# Patient Record
Sex: Female | Born: 1985 | Hispanic: Yes | Marital: Single | Smoking: Never smoker
Health system: Southern US, Community
[De-identification: ages and names within clinical notes are randomized; demographics above are authoritative.]

## PROBLEM LIST (undated history)

## (undated) ENCOUNTER — Inpatient Hospital Stay (HOSPITAL_COMMUNITY): Payer: Self-pay

## (undated) DIAGNOSIS — N189 Chronic kidney disease, unspecified: Secondary | ICD-10-CM

## (undated) DIAGNOSIS — J45909 Unspecified asthma, uncomplicated: Secondary | ICD-10-CM

## (undated) DIAGNOSIS — N83209 Unspecified ovarian cyst, unspecified side: Secondary | ICD-10-CM

## (undated) DIAGNOSIS — T4145XA Adverse effect of unspecified anesthetic, initial encounter: Secondary | ICD-10-CM

## (undated) DIAGNOSIS — T8859XA Other complications of anesthesia, initial encounter: Secondary | ICD-10-CM

## (undated) HISTORY — PX: NO PAST SURGERIES: SHX2092

---

## 2012-07-02 ENCOUNTER — Encounter (HOSPITAL_COMMUNITY): Payer: Self-pay | Admitting: Emergency Medicine

## 2012-07-02 DIAGNOSIS — N390 Urinary tract infection, site not specified: Secondary | ICD-10-CM | POA: Insufficient documentation

## 2012-07-02 DIAGNOSIS — M549 Dorsalgia, unspecified: Secondary | ICD-10-CM | POA: Insufficient documentation

## 2012-07-02 LAB — POCT PREGNANCY, URINE: Preg Test, Ur: NEGATIVE

## 2012-07-02 LAB — URINE MICROSCOPIC-ADD ON

## 2012-07-02 LAB — CBC WITH DIFFERENTIAL/PLATELET
Basophils Relative: 0 % (ref 0–1)
Eosinophils Relative: 2 % (ref 0–5)
HCT: 33.2 % — ABNORMAL LOW (ref 36.0–46.0)
Hemoglobin: 11.2 g/dL — ABNORMAL LOW (ref 12.0–15.0)
MCH: 29.6 pg (ref 26.0–34.0)
MCHC: 33.7 g/dL (ref 30.0–36.0)
MCV: 87.6 fL (ref 78.0–100.0)
Monocytes Absolute: 0.7 10*3/uL (ref 0.1–1.0)
Monocytes Relative: 8 % (ref 3–12)
Neutro Abs: 4.3 10*3/uL (ref 1.7–7.7)
RDW: 15.7 % — ABNORMAL HIGH (ref 11.5–15.5)

## 2012-07-02 LAB — COMPREHENSIVE METABOLIC PANEL
Albumin: 4 g/dL (ref 3.5–5.2)
BUN: 7 mg/dL (ref 6–23)
Calcium: 9.7 mg/dL (ref 8.4–10.5)
Chloride: 103 mEq/L (ref 96–112)
Creatinine, Ser: 0.59 mg/dL (ref 0.50–1.10)
GFR calc non Af Amer: >90 mL/min (ref >90)
Total Bilirubin: 0.2 mg/dL — ABNORMAL LOW (ref 0.3–1.2)

## 2012-07-02 LAB — URINALYSIS, ROUTINE W REFLEX MICROSCOPIC
Bilirubin Urine: NEGATIVE
Glucose, UA: NEGATIVE mg/dL
Ketones, ur: NEGATIVE mg/dL
Protein, ur: NEGATIVE mg/dL
pH: 7 (ref 5.0–8.0)

## 2012-07-02 NOTE — ED Notes (Signed)
Patient complaining of left and right flank pain, fever, burning during urination, and pressure in her lower abdomen.  Patient also complaining of nausea.  Denies diarrhea.  Patient reports history of urinary tract infections in the past.

## 2012-07-03 ENCOUNTER — Emergency Department (HOSPITAL_COMMUNITY)
Admission: EM | Admit: 2012-07-03 | Discharge: 2012-07-03 | Disposition: A | Discharge status: Home / Self Care | Payer: Self-pay | Attending: Emergency Medicine | Admitting: Emergency Medicine

## 2012-07-03 DIAGNOSIS — M549 Dorsalgia, unspecified: Secondary | ICD-10-CM

## 2012-07-03 DIAGNOSIS — N390 Urinary tract infection, site not specified: Secondary | ICD-10-CM

## 2012-07-03 MED ORDER — CYCLOBENZAPRINE HCL 5 MG PO TABS: 5.0000 mg | ORAL_TABLET | Freq: Three times a day (TID) | ORAL | Status: AC | PRN

## 2012-07-03 MED ORDER — CEPHALEXIN 250 MG PO CAPS
500.0000 mg | ORAL_CAPSULE | Freq: Once | ORAL | Status: AC
  Administered 2012-07-03: 500 mg via ORAL
  Filled 2012-07-03: qty 2

## 2012-07-03 MED ORDER — PHENAZOPYRIDINE HCL 100 MG PO TABS
200.0000 mg | ORAL_TABLET | Freq: Once | ORAL | Status: AC
  Administered 2012-07-03: 200 mg via ORAL
  Filled 2012-07-03: qty 2

## 2012-07-03 MED ORDER — PHENAZOPYRIDINE HCL 200 MG PO TABS: 200.0000 mg | ORAL_TABLET | Freq: Three times a day (TID) | ORAL | Status: AC

## 2012-07-03 MED ORDER — KETOROLAC TROMETHAMINE 60 MG/2ML IM SOLN
60.0000 mg | Freq: Once | INTRAMUSCULAR | Status: AC
  Administered 2012-07-03: 60 mg via INTRAMUSCULAR
  Filled 2012-07-03: qty 2

## 2012-07-03 MED ORDER — CEPHALEXIN 500 MG PO CAPS: 500.0000 mg | ORAL_CAPSULE | Freq: Three times a day (TID) | ORAL | Status: AC

## 2012-07-03 NOTE — ED Provider Notes (Signed)
History     CSN: [id number]  Arrival date & time 07/02/12  2202   First MD Initiated Contact with Patient 07/03/12 [phone]      Chief Complaint  Patient presents with  . Urinary Tract Infection  . Flank Pain    (Consider location/radiation/quality/duration/timing/severity/associated sxs/prior treatment) HPI  Patient reports about 3 days ago she started having dysuria, frequency, but denies urgency or hematuria. She's had nausea without vomiting or diarrhea. She relates she has pain in her bilateral flanks and over her suprapubic region. She denies any vaginal discharge.  PCP women[facility] health department in [facility]  History reviewed. No pertinent past medical history.  History reviewed. No pertinent past surgical history.  History reviewed. No pertinent family history.  History  Substance Use Topics  . Smoking status: Never Smoker   . Smokeless tobacco: Not on file  . Alcohol Use: Yes  employed  OB History    Grav Para Term Preterm Abortions TAB SAB Ect Mult Living                  Review of Systems  All other systems reviewed and are negative.    Allergies  Review of patient's allergies indicates no known allergies.  Home Medications   Current Outpatient Rx  Name Route Sig Dispense Refill  . CEPHALEXIN 500 MG PO CAPS Oral Take 1 capsule (500 mg total) by mouth 3 (three) times daily. 30 capsule 0  . CYCLOBENZAPRINE HCL 5 MG PO TABS Oral Take 1 tablet (5 mg total) by mouth 3 (three) times daily as needed for muscle spasms. 30 tablet 0  . PHENAZOPYRIDINE HCL 200 MG PO TABS Oral Take 1 tablet (200 mg total) by mouth 3 (three) times daily. 6 tablet 0   Implanon  BP 100/59  Pulse 63  Temp 98 F (36.7 C) (Oral)  Resp 16  SpO2 100%  LMP 06/24/2012  Vital signs normal    Physical Exam  Nursing note and vitals reviewed. Constitutional: She is oriented to person, place, and time. She appears well-developed and well-nourished.  Non-toxic appearance. She does  not appear ill. No distress.  HENT:  Head: Normocephalic and atraumatic.  Right Ear: External ear normal.  Left Ear: External ear normal.  Nose: Nose normal. No mucosal edema or rhinorrhea.  Mouth/Throat: Oropharynx is clear and moist and mucous membranes are normal. No dental abscesses or uvula swelling.  Eyes: Conjunctivae and EOM are normal. Pupils are equal, round, and reactive to light.  Neck: Normal range of motion and full passive range of motion without pain. Neck supple.  Cardiovascular: Normal rate, regular rhythm and normal heart sounds.  Exam reveals no gallop and no friction rub.   No murmur heard. Pulmonary/Chest: Effort normal and breath sounds normal. No respiratory distress. She has no wheezes. She has no rhonchi. She has no rales. She exhibits no tenderness and no crepitus.  Abdominal: Soft. Normal appearance and bowel sounds are normal. She exhibits no distension. There is tenderness. There is no rebound and no guarding.         Patient has bilateral flank tenderness and is tender to palpation and changing positions.  Musculoskeletal: Normal range of motion. She exhibits no edema and no tenderness.       Arms:      Moves all extremities well.   Neurological: She is alert and oriented to person, place, and time. She has normal strength. No cranial nerve deficit.  Skin: Skin is warm, dry and intact. No  rash noted. No erythema. No pallor.  Psychiatric: She has a normal mood and affect. Her speech is normal and behavior is normal. Her mood appears not anxious.    ED Course  Procedures (including critical care time)   Medications  ketorolac (TORADOL) injection 60 mg (60 mg Intramuscular Given 07/03/12 0439)  phenazopyridine (PYRIDIUM) tablet 200 mg (200 mg Oral Given 07/03/12 0438)  cephALEXin (KEFLEX) capsule 500 mg (500 mg Oral Given 07/03/12 0438)      Results for orders placed during the hospital encounter of 07/03/12  URINALYSIS, ROUTINE W REFLEX MICROSCOPIC       Component Value Range   Color, Urine YELLOW  YELLOW   APPearance CLEAR  CLEAR   Specific Gravity, Urine 1.009  1.005 - 1.030   pH 7.0  5.0 - 8.0   Glucose, UA NEGATIVE  NEGATIVE mg/dL   Hgb urine dipstick TRACE (*) NEGATIVE   Bilirubin Urine NEGATIVE  NEGATIVE   Ketones, ur NEGATIVE  NEGATIVE mg/dL   Protein, ur NEGATIVE  NEGATIVE mg/dL   Urobilinogen, UA 0.2  0.0 - 1.0 mg/dL   Nitrite NEGATIVE  NEGATIVE   Leukocytes, UA SMALL (*) NEGATIVE  CBC WITH DIFFERENTIAL      Component Value Range   WBC 8.3  4.0 - 10.5 K/uL   RBC 3.79 (*) 3.87 - 5.11 MIL/uL   Hemoglobin 11.2 (*) 12.0 - 15.0 g/dL   HCT 11.9 (*) 14.7 - 82.9 %   MCV 87.6  78.0 - 100.0 fL   MCH 29.6  26.0 - 34.0 pg   MCHC 33.7  30.0 - 36.0 g/dL   RDW 56.2 (*) 13.0 - 86.5 %   Platelets 296  150 - 400 K/uL   Neutrophils Relative 52  43 - 77 %   Neutro Abs 4.3  1.7 - 7.7 K/uL   Lymphocytes Relative 37  12 - 46 %   Lymphs Abs 3.0  0.7 - 4.0 K/uL   Monocytes Relative 8  3 - 12 %   Monocytes Absolute 0.7  0.1 - 1.0 K/uL   Eosinophils Relative 2  0 - 5 %   Eosinophils Absolute 0.2  0.0 - 0.7 K/uL   Basophils Relative 0  0 - 1 %   Basophils Absolute 0.0  0.0 - 0.1 K/uL  COMPREHENSIVE METABOLIC PANEL      Component Value Range   Sodium 138  135 - 145 mEq/L   Potassium 4.2  3.5 - 5.1 mEq/L   Chloride 103  96 - 112 mEq/L   CO2 26  19 - 32 mEq/L   Glucose, Bld 88  70 - 99 mg/dL   BUN 7  6 - 23 mg/dL   Creatinine, Ser 7.84  0.50 - 1.10 mg/dL   Calcium 9.7  8.4 - 69.6 mg/dL   Total Protein 8.0  6.0 - 8.3 g/dL   Albumin 4.0  3.5 - 5.2 g/dL   AST 11  0 - 37 U/L   ALT 8  0 - 35 U/L   Alkaline Phosphatase 83  39 - 117 U/L   Total Bilirubin 0.2 (*) 0.3 - 1.2 mg/dL   GFR calc non Af Amer >90  >90 mL/min   GFR calc Af Amer >90  >90 mL/min  POCT PREGNANCY, URINE      Component Value Range   Preg Test, Ur NEGATIVE  NEGATIVE  URINE MICROSCOPIC-ADD ON      Component Value Range   Squamous Epithelial / LPF FEW (*) RARE  WBC, UA  3-6  <3 WBC/hpf   RBC / HPF 0-2  <3 RBC/hpf   Bacteria, UA RARE  RARE   Laboratory interpretation all normal except possible UTI, mild anemia       1. Urinary tract infection   2. Back pain     New Prescriptions   CEPHALEXIN (KEFLEX) 500 MG CAPSULE    Take 1 capsule (500 mg total) by mouth 3 (three) times daily.   CYCLOBENZAPRINE (FLEXERIL) 5 MG TABLET    Take 1 tablet (5 mg total) by mouth 3 (three) times daily as needed for muscle spasms.   PHENAZOPYRIDINE (PYRIDIUM) 200 MG TABLET    Take 1 tablet (200 mg total) by mouth 3 (three) times daily.  ibuprofen 600 mg QID OTC  Plan discharge  Devoria Albe, MD, FACEP   MDM          Ward Givens, MD 07/03/12 [phone]

## 2013-08-08 ENCOUNTER — Emergency Department (HOSPITAL_COMMUNITY): Payer: Self-pay

## 2013-08-08 ENCOUNTER — Emergency Department (HOSPITAL_COMMUNITY)
Admission: EM | Admit: 2013-08-08 | Discharge: 2013-08-08 | Disposition: A | Discharge status: Home / Self Care | Payer: Self-pay | Attending: Emergency Medicine | Admitting: Emergency Medicine

## 2013-08-08 ENCOUNTER — Encounter (HOSPITAL_COMMUNITY): Payer: Self-pay | Admitting: Emergency Medicine

## 2013-08-08 DIAGNOSIS — N939 Abnormal uterine and vaginal bleeding, unspecified: Secondary | ICD-10-CM

## 2013-08-08 DIAGNOSIS — N898 Other specified noninflammatory disorders of vagina: Secondary | ICD-10-CM | POA: Insufficient documentation

## 2013-08-08 DIAGNOSIS — Z3202 Encounter for pregnancy test, result negative: Secondary | ICD-10-CM | POA: Insufficient documentation

## 2013-08-08 DIAGNOSIS — R1084 Generalized abdominal pain: Secondary | ICD-10-CM | POA: Insufficient documentation

## 2013-08-08 DIAGNOSIS — N949 Unspecified condition associated with female genital organs and menstrual cycle: Secondary | ICD-10-CM | POA: Insufficient documentation

## 2013-08-08 DIAGNOSIS — N83209 Unspecified ovarian cyst, unspecified side: Secondary | ICD-10-CM | POA: Insufficient documentation

## 2013-08-08 HISTORY — DX: Unspecified ovarian cyst, unspecified side: N83.209

## 2013-08-08 LAB — WET PREP, GENITAL

## 2013-08-08 LAB — CBC WITH DIFFERENTIAL/PLATELET
Basophils Relative: 1 % (ref 0–1)
Eosinophils Absolute: 0.1 10*3/uL (ref 0.0–0.7)
Eosinophils Relative: 2 % (ref 0–5)
HCT: 30.6 % — ABNORMAL LOW (ref 36.0–46.0)
Hemoglobin: 10.5 g/dL — ABNORMAL LOW (ref 12.0–15.0)
MCH: 30.3 pg (ref 26.0–34.0)
MCHC: 34.3 g/dL (ref 30.0–36.0)
MCV: 88.2 fL (ref 78.0–100.0)
Monocytes Absolute: 0.6 10*3/uL (ref 0.1–1.0)
Monocytes Relative: 10 % (ref 3–12)

## 2013-08-08 LAB — BASIC METABOLIC PANEL
BUN: 11 mg/dL (ref 6–23)
Calcium: 8.4 mg/dL (ref 8.4–10.5)
Chloride: 106 mEq/L (ref 96–112)
Creatinine, Ser: 0.66 mg/dL (ref 0.50–1.10)
GFR calc Af Amer: >90 mL/min (ref >90)
GFR calc non Af Amer: >90 mL/min (ref >90)

## 2013-08-08 LAB — URINALYSIS, ROUTINE W REFLEX MICROSCOPIC
Bilirubin Urine: NEGATIVE
Glucose, UA: NEGATIVE mg/dL
Ketones, ur: NEGATIVE mg/dL
Protein, ur: NEGATIVE mg/dL
pH: 6.5 (ref 5.0–8.0)

## 2013-08-08 LAB — POCT PREGNANCY, URINE: Preg Test, Ur: NEGATIVE

## 2013-08-08 MED ORDER — HYDROCODONE-ACETAMINOPHEN 5-325 MG PO TABS: 1.0000 | ORAL_TABLET | ORAL | Status: DC | PRN

## 2013-08-08 MED ORDER — ONDANSETRON HCL 4 MG/2ML IJ SOLN
4.0000 mg | Freq: Once | INTRAMUSCULAR | Status: AC
  Administered 2013-08-08: 4 mg via INTRAVENOUS
  Filled 2013-08-08: qty 2

## 2013-08-08 MED ORDER — MORPHINE SULFATE 4 MG/ML IJ SOLN
4.0000 mg | Freq: Once | INTRAMUSCULAR | Status: AC
  Administered 2013-08-08: 4 mg via INTRAVENOUS
  Filled 2013-08-08: qty 1

## 2013-08-08 MED ORDER — KETOROLAC TROMETHAMINE 30 MG/ML IJ SOLN
30.0000 mg | Freq: Once | INTRAMUSCULAR | Status: AC
  Administered 2013-08-08: 30 mg via INTRAVENOUS
  Filled 2013-08-08: qty 1

## 2013-08-08 MED ORDER — PROMETHAZINE HCL 25 MG PO TABS: 25.0000 mg | ORAL_TABLET | Freq: Four times a day (QID) | ORAL | Status: DC | PRN

## 2013-08-08 MED ORDER — SODIUM CHLORIDE 0.9 % IV BOLUS (SEPSIS)
1000.0000 mL | Freq: Once | INTRAVENOUS | Status: AC
  Administered 2013-08-08: 1000 mL via INTRAVENOUS

## 2013-08-08 NOTE — ED Notes (Signed)
Pt ambulatory to BR, unable to provide urine sample, PA notified, ok to in/out pt to obtain urine sample

## 2013-08-08 NOTE — ED Provider Notes (Signed)
CSN: [id number]     Arrival date & time 08/08/13  1454 History   First MD Initiated Contact with Patient 08/08/13 1548     Chief Complaint  Patient presents with  . Abdominal Pain   (Consider location/radiation/quality/duration/timing/severity/associated sxs/prior Treatment) HPI  27 year old female with history of ovarian cyst presents complaining of abdominal pain and vaginal bleeding. Patient states for the past 3 months she has had persistent vaginal bleeding. Describe vaginal bleeding as having 1-2 pads per day for 4-5 days out of each week ongoing for 3 months. Complaining of pressure sensation to the lower abdomen, nonradiating, mild to moderate. Has notice clots in her vaginal bleeding. No specific treatment tried. Denies any fever, lightheadedness, dizziness, chest pain, shortness of breath, back pain, dysuria, hematuria, vaginal discharge, or rash. She is a G2 P2. She has implanon for the past 3 years.   Past Medical History  Diagnosis Date  . Ovarian cyst    History reviewed. No pertinent past surgical history. No family history on file. History  Substance Use Topics  . Smoking status: Never Smoker   . Smokeless tobacco: Not on file  . Alcohol Use: Yes   OB History   Grav Para Term Preterm Abortions TAB SAB Ect Mult Living                 Review of Systems  Respiratory: Negative for shortness of breath.   Cardiovascular: Negative for chest pain.  Gastrointestinal: Positive for abdominal pain.  Genitourinary: Positive for vaginal bleeding and pelvic pain. Negative for dysuria and flank pain.  Musculoskeletal: Negative for back pain.  Skin: Negative for rash.  All other systems reviewed and are negative.    Allergies  Review of patient's allergies indicates no known allergies.  Home Medications  No current outpatient prescriptions on file. There were no vitals taken for this visit. Physical Exam  Nursing note and vitals reviewed. Constitutional: She appears  well-developed and well-nourished. No distress.  Awake, alert, nontoxic appearance  HENT:  Head: Normocephalic and atraumatic.  Eyes: Conjunctivae are normal. Right eye exhibits no discharge. Left eye exhibits no discharge.  Neck: Normal range of motion. Neck supple.  Cardiovascular: Normal rate and regular rhythm.   Pulmonary/Chest: Effort normal and breath sounds normal. No respiratory distress. She exhibits no tenderness.  Abdominal: Soft. There is tenderness (Diffuse abdominal tenderness most significant to suprapubic without guarding or rebound tenderness. Abdomen is nondistended. No peritoneal sign.). There is no rebound.  Genitourinary: Uterus normal. There is no rash or lesion on the right labia. There is no rash or lesion on the left labia. Cervix exhibits no motion tenderness and no discharge. Right adnexum displays tenderness. Right adnexum displays no mass. Left adnexum displays tenderness. Left adnexum displays no mass. There is bleeding around the vagina. No erythema or tenderness around the vagina. No vaginal discharge found.  Chaperone present  Musculoskeletal: She exhibits no tenderness.  ROM appears intact, no obvious focal weakness  Lymphadenopathy:       Right: No inguinal adenopathy present.       Left: No inguinal adenopathy present.  Neurological:  Mental status and motor strength appears intact  Skin: No rash noted.  Psychiatric: She has a normal mood and affect.    ED Course  Procedures (including critical care time)  4:09 PM Patient with menorrhagia. Has adnexal tenderness on exam without CMT.  Work up initiated.   7:48 PM Ultrasound shows a 3.2 cm diameter right ovarian cyst. No other acute finding noted.  Patient continues to endorse low abdominal pain. Currently awaits UA and pregnancy test. We'll continue to manage her symptoms.  8:42 PM Pregnancy test is negative, urinalysis shows no evidence of UTI. The symptom has improved minimally, we'll give pain  medication we'll continue to monitor. Patient will receive outpatient referral to OB/GYN for further care. Doubt appendicitis as pt report having her lower abd pain x 1 month.  No significant anemia.  Can tolerates PO.  Referral to OBGYN.      Labs Review Labs Reviewed  WET PREP, GENITAL - Abnormal; Notable for the following:    Clue Cells Wet Prep HPF POC FEW (*)    WBC, Wet Prep HPF POC FEW (*)    All other components within normal limits  CBC WITH DIFFERENTIAL - Abnormal; Notable for the following:    RBC 3.47 (*)    Hemoglobin 10.5 (*)    HCT 30.6 (*)    Neutrophils Relative % 42 (*)    All other components within normal limits  GC/CHLAMYDIA PROBE AMP  URINALYSIS, ROUTINE W REFLEX MICROSCOPIC  BASIC METABOLIC PANEL  POCT PREGNANCY, URINE   Imaging Review US Transvaginal Non-ob  08/08/2013   CLINICAL DATA:  Vaginal bleeding  EXAM: TRANSABDOMINAL AND TRANSVAGINAL ULTRASOUND OF PELVIS  TECHNIQUE: Both transabdominal and transvaginal ultrasound examinations of the pelvis were performed. Transabdominal technique was performed for global imaging of the pelvis including uterus, ovaries, adnexal regions, and pelvic cul-de-sac. It was necessary to proceed with endovaginal exam following the transabdominal exam to visualize the endometrium and adnexae.  COMPARISON:  None  FINDINGS: Uterus  Measurements: 8.7 x 3.8 x 4.5 cm. Normal morphology without mass.  Endometrium  Thickness: 4 mm thick, normal. No mass or endometrial fluid.  Right ovary  Measurements: 4.0 x 2.5 x 2.9 cm. 3.2 x 1.4 x 2.1 cm diameter simple cyst within right ovary. Internal blood flow present within right ovary on color Doppler imaging.  Left ovary  Measurements: 3.7 x 2.5 x 2.2 cm. Normal morphology without mass. Internal blood flow present within left ovary on color Doppler imaging.  Other findings  No adnexal masses or free pelvic fluid otherwise seen.  IMPRESSION: 3.2 cm diameter right ovarian cyst.  Otherwise normal exam.    Electronically Signed   By: Ulyses Southward M.D.   On: 08/08/2013 18:13   US Pelvis Complete  08/08/2013   CLINICAL DATA:  Vaginal bleeding  EXAM: TRANSABDOMINAL AND TRANSVAGINAL ULTRASOUND OF PELVIS  TECHNIQUE: Both transabdominal and transvaginal ultrasound examinations of the pelvis were performed. Transabdominal technique was performed for global imaging of the pelvis including uterus, ovaries, adnexal regions, and pelvic cul-de-sac. It was necessary to proceed with endovaginal exam following the transabdominal exam to visualize the endometrium and adnexae.  COMPARISON:  None  FINDINGS: Uterus  Measurements: 8.7 x 3.8 x 4.5 cm. Normal morphology without mass.  Endometrium  Thickness: 4 mm thick, normal. No mass or endometrial fluid.  Right ovary  Measurements: 4.0 x 2.5 x 2.9 cm. 3.2 x 1.4 x 2.1 cm diameter simple cyst within right ovary. Internal blood flow present within right ovary on color Doppler imaging.  Left ovary  Measurements: 3.7 x 2.5 x 2.2 cm. Normal morphology without mass. Internal blood flow present within left ovary on color Doppler imaging.  Other findings  No adnexal masses or free pelvic fluid otherwise seen.  IMPRESSION: 3.2 cm diameter right ovarian cyst.  Otherwise normal exam.   Electronically Signed   By: Angelyn Punt.D.  On: 08/08/2013 18:13    MDM   1. Ovarian cyst   2. Lower abdominal pain, unspecified laterality   3. Vaginal bleeding    BP 103/66  Pulse 60  Temp(Src) 98.5 F (36.9 C) (Oral)  Resp 16  I have reviewed nursing notes and vital signs. I personally reviewed the imaging tests through PACS system  I reviewed available ER/hospitalization records thought the EMR     Fayrene Helper, [place] 08/08/13 2140

## 2013-08-08 NOTE — ED Notes (Signed)
Pt states that she has an ovarian cyst and that is causing her vaginal bleeding

## 2013-08-08 NOTE — ED Notes (Signed)
Awaiting pts ride to return

## 2013-08-20 NOTE — ED Provider Notes (Signed)
Medical screening examination/treatment/procedure(s) were performed by non-physician practitioner and as supervising physician I was immediately available for consultation/collaboration.    Bronsyn Shappell L Cassell Voorhies, MD 08/20/13 0802 

## 2014-05-13 ENCOUNTER — Emergency Department (HOSPITAL_COMMUNITY): Payer: Self-pay

## 2014-05-13 ENCOUNTER — Emergency Department (HOSPITAL_COMMUNITY)
Admission: EM | Admit: 2014-05-13 | Discharge: 2014-05-13 | Disposition: A | Discharge status: Home / Self Care | Payer: Self-pay | Attending: Emergency Medicine | Admitting: Emergency Medicine

## 2014-05-13 ENCOUNTER — Encounter (HOSPITAL_COMMUNITY): Payer: Self-pay | Admitting: Emergency Medicine

## 2014-05-13 DIAGNOSIS — K5900 Constipation, unspecified: Secondary | ICD-10-CM | POA: Insufficient documentation

## 2014-05-13 DIAGNOSIS — N83209 Unspecified ovarian cyst, unspecified side: Secondary | ICD-10-CM | POA: Insufficient documentation

## 2014-05-13 DIAGNOSIS — Z3202 Encounter for pregnancy test, result negative: Secondary | ICD-10-CM | POA: Insufficient documentation

## 2014-05-13 DIAGNOSIS — N83202 Unspecified ovarian cyst, left side: Secondary | ICD-10-CM

## 2014-05-13 DIAGNOSIS — R300 Dysuria: Secondary | ICD-10-CM | POA: Insufficient documentation

## 2014-05-13 DIAGNOSIS — N83201 Unspecified ovarian cyst, right side: Secondary | ICD-10-CM

## 2014-05-13 LAB — CBC WITH DIFFERENTIAL/PLATELET
BASOS ABS: 0 10*3/uL (ref 0.0–0.1)
Basophils Relative: 0 % (ref 0–1)
EOS ABS: 0.1 10*3/uL (ref 0.0–0.7)
EOS PCT: 1 % (ref 0–5)
HCT: 37.6 % (ref 36.0–46.0)
Hemoglobin: 12.6 g/dL (ref 12.0–15.0)
Lymphocytes Relative: 31 % (ref 12–46)
Lymphs Abs: 3.1 10*3/uL (ref 0.7–4.0)
MCH: 29.2 pg (ref 26.0–34.0)
MCHC: 33.5 g/dL (ref 30.0–36.0)
MCV: 87 fL (ref 78.0–100.0)
Monocytes Absolute: 0.7 10*3/uL (ref 0.1–1.0)
Monocytes Relative: 7 % (ref 3–12)
NEUTROS PCT: 61 % (ref 43–77)
Neutro Abs: 6 10*3/uL (ref 1.7–7.7)
PLATELETS: 297 10*3/uL (ref 150–400)
RBC: 4.32 MIL/uL (ref 3.87–5.11)
RDW: 14.4 % (ref 11.5–15.5)
WBC: 9.9 10*3/uL (ref 4.0–10.5)

## 2014-05-13 LAB — URINALYSIS, ROUTINE W REFLEX MICROSCOPIC
Bilirubin Urine: NEGATIVE
GLUCOSE, UA: NEGATIVE mg/dL
Hgb urine dipstick: NEGATIVE
Ketones, ur: NEGATIVE mg/dL
LEUKOCYTES UA: NEGATIVE
Nitrite: NEGATIVE
PH: 6 (ref 5.0–8.0)
PROTEIN: NEGATIVE mg/dL
Specific Gravity, Urine: 1.011 (ref 1.005–1.030)
Urobilinogen, UA: 0.2 mg/dL (ref 0.0–1.0)

## 2014-05-13 LAB — COMPREHENSIVE METABOLIC PANEL
ALT: 10 U/L (ref 0–35)
AST: 10 U/L (ref 0–37)
Albumin: 3.9 g/dL (ref 3.5–5.2)
Alkaline Phosphatase: 83 U/L (ref 39–117)
Anion gap: 11 (ref 5–15)
BUN: 13 mg/dL (ref 6–23)
CALCIUM: 9.8 mg/dL (ref 8.4–10.5)
CO2: 25 mEq/L (ref 19–32)
Chloride: 101 mEq/L (ref 96–112)
Creatinine, Ser: 0.62 mg/dL (ref 0.50–1.10)
GFR calc Af Amer: >90 mL/min (ref >90)
GFR calc non Af Amer: >90 mL/min (ref >90)
Glucose, Bld: 88 mg/dL (ref 70–99)
POTASSIUM: 4.4 meq/L (ref 3.7–5.3)
SODIUM: 137 meq/L (ref 137–147)
TOTAL PROTEIN: 8.4 g/dL — AB (ref 6.0–8.3)
Total Bilirubin: 0.2 mg/dL — ABNORMAL LOW (ref 0.3–1.2)

## 2014-05-13 LAB — WET PREP, GENITAL
TRICH WET PREP: NONE SEEN
YEAST WET PREP: NONE SEEN

## 2014-05-13 LAB — POC URINE PREG, ED: Preg Test, Ur: NEGATIVE

## 2014-05-13 MED ORDER — HYDROCODONE-ACETAMINOPHEN 5-325 MG PO TABS: 1.0000 | ORAL_TABLET | ORAL | Status: DC | PRN

## 2014-05-13 MED ORDER — MORPHINE SULFATE 4 MG/ML IJ SOLN
4.0000 mg | Freq: Once | INTRAMUSCULAR | Status: AC
  Administered 2014-05-13: 4 mg via INTRAVENOUS
  Filled 2014-05-13: qty 1

## 2014-05-13 MED ORDER — ONDANSETRON HCL 4 MG/2ML IJ SOLN
4.0000 mg | Freq: Once | INTRAMUSCULAR | Status: AC
  Administered 2014-05-13: 4 mg via INTRAVENOUS
  Filled 2014-05-13: qty 2

## 2014-05-13 MED ORDER — SODIUM CHLORIDE 0.9 % IV BOLUS (SEPSIS)
1000.0000 mL | Freq: Once | INTRAVENOUS | Status: AC
  Administered 2014-05-13: 1000 mL via INTRAVENOUS

## 2014-05-13 NOTE — ED Notes (Signed)
Pt presents to ed with c/o right lower abdominal pain with radiation to back, also reports nausea and vomiting, sts pressure with urination

## 2014-05-13 NOTE — ED Provider Notes (Signed)
Medical screening examination/treatment/procedure(s) were performed by non-physician practitioner and as supervising physician I was immediately available for consultation/collaboration.   EKG Interpretation None       Ethelda ChickMartha K Linker, MD 05/13/14 2312

## 2014-05-13 NOTE — Discharge Instructions (Signed)
Take vicodin as needed for severe pain - Please be careful with this medication.  It can cause drowsiness.  Use caution while driving, operating machinery, drinking alcohol, or any other activities that may impair your physical or mental abilities.   Take Ibuprofen as needed for mild-moderate pain  Return to the emergency department if you develop any changing/worsening condition, fever, change or worsening pain, repeated vomiting, or any other concerns (please read additional information regarding your condition below)   Ovarian Cyst An ovarian cyst is a fluid-filled sac that forms on an ovary. The ovaries are small organs that produce eggs in women. Various types of cysts can form on the ovaries. Most are not cancerous. Many do not cause problems, and they often go away on their own. Some may cause symptoms and require treatment. Common types of ovarian cysts include:  Functional cysts--These cysts may occur every month during the menstrual cycle. This is normal. The cysts usually go away with the next menstrual cycle if the woman does not get pregnant. Usually, there are no symptoms with a functional cyst.  Endometrioma cysts--These cysts form from the tissue that lines the uterus. They are also called "chocolate cysts" because they become filled with blood that turns brown. This type of cyst can cause pain in the lower abdomen during intercourse and with your menstrual period.  Cystadenoma cysts--This type develops from the cells on the outside of the ovary. These cysts can get very big and cause lower abdomen pain and pain with intercourse. This type of cyst can twist on itself, cut off its blood supply, and cause severe pain. It can also easily rupture and cause a lot of pain.  Dermoid cysts--This type of cyst is sometimes found in both ovaries. These cysts may contain different kinds of body tissue, such as skin, teeth, hair, or cartilage. They usually do not cause symptoms unless they get very  big.  Theca lutein cysts--These cysts occur when too much of a certain hormone (human chorionic gonadotropin) is produced and overstimulates the ovaries to produce an egg. This is most common after procedures used to assist with the conception of a baby (in vitro fertilization). CAUSES   Fertility drugs can cause a condition in which multiple large cysts are formed on the ovaries. This is called ovarian hyperstimulation syndrome.  A condition called polycystic ovary syndrome can cause hormonal imbalances that can lead to nonfunctional ovarian cysts. SIGNS AND SYMPTOMS  Many ovarian cysts do not cause symptoms. If symptoms are present, they may include:  Pelvic pain or pressure.  Pain in the lower abdomen.  Pain during sexual intercourse.  Increasing girth (swelling) of the abdomen.  Abnormal menstrual periods.  Increasing pain with menstrual periods.  Stopping having menstrual periods without being pregnant. DIAGNOSIS  These cysts are commonly found during a routine or annual pelvic exam. Tests may be ordered to find out more about the cyst. These tests may include:  Ultrasound.  X-ray of the pelvis.  CT scan.  MRI.  Blood tests. TREATMENT  Many ovarian cysts go away on their own without treatment. Your health care provider may want to check your cyst regularly for 2-3 months to see if it changes. For women in menopause, it is particularly important to monitor a cyst closely because of the higher rate of ovarian cancer in menopausal women. When treatment is needed, it may include any of the following:  A procedure to drain the cyst (aspiration). This may be done using a long needle  and ultrasound. It can also be done through a laparoscopic procedure. This involves using a thin, lighted tube with a tiny camera on the end (laparoscope) inserted through a small incision.  Surgery to remove the whole cyst. This may be done using laparoscopic surgery or an open surgery involving a  larger incision in the lower abdomen.  Hormone treatment or birth control pills. These methods are sometimes used to help dissolve a cyst. HOME CARE INSTRUCTIONS   Only take over-the-counter or prescription medicines as directed by your health care provider.  Follow up with your health care provider as directed.  Get regular pelvic exams and Pap tests. SEEK MEDICAL CARE IF:   Your periods are late, irregular, or painful, or they stop.  Your pelvic pain or abdominal pain does not go away.  Your abdomen becomes larger or swollen.  You have pressure on your bladder or trouble emptying your bladder completely.  You have pain during sexual intercourse.  You have feelings of fullness, pressure, or discomfort in your stomach.  You lose weight for no apparent reason.  You feel generally ill.  You become constipated.  You lose your appetite.  You develop acne.  You have an increase in body and facial hair.  You are gaining weight, without changing your exercise and eating habits.  You think you are pregnant. SEEK IMMEDIATE MEDICAL CARE IF:   You have increasing abdominal pain.  You feel sick to your stomach (nauseous), and you throw up (vomit).  You develop a fever that comes on suddenly.  You have abdominal pain during a bowel movement.  Your menstrual periods become heavier than usual. MAKE SURE YOU:  Understand these instructions.  Will watch your condition.  Will get help right away if you are not doing well or get worse. Document Released: 10/28/2005 Document Revised: 11/02/2013 Document Reviewed: 07/05/2013 [facility] Patient Information 2015 [place], [place]. This information is not intended to replace advice given to you by your health care provider. Make sure you discuss any questions you have with your health care provider.  Abdominal Pain Many things can cause abdominal pain. Usually, abdominal pain is not caused by a disease and will improve without  treatment. It can often be observed and treated at home. Your health care provider will do a physical exam and possibly order blood tests and X-rays to help determine the seriousness of your pain. However, in many cases, more time must pass before a clear cause of the pain can be found. Before that point, your health care provider may not know if you need more testing or further treatment. HOME CARE INSTRUCTIONS  Monitor your abdominal pain for any changes. The following actions may help to alleviate any discomfort you are experiencing: Only take over-the-counter or prescription medicines as directed by your health care provider. Do not take laxatives unless directed to do so by your health care provider. Try a clear liquid diet (broth, tea, or water) as directed by your health care provider. Slowly move to a bland diet as tolerated. SEEK MEDICAL CARE IF: You have unexplained abdominal pain. You have abdominal pain associated with nausea or diarrhea. You have pain when you urinate or have a bowel movement. You experience abdominal pain that wakes you in the night. You have abdominal pain that is worsened or improved by eating food. You have abdominal pain that is worsened with eating fatty foods. You have a fever. SEEK IMMEDIATE MEDICAL CARE IF:  Your pain does not go away within 2  hours. You keep throwing up (vomiting). Your pain is felt only in portions of the abdomen, such as the right side or the left lower portion of the abdomen. You pass bloody or black tarry stools. MAKE SURE YOU: Understand these instructions.  Will watch your condition.  Will get help right away if you are not doing well or get worse.  Document Released: 08/07/2005 Document Revised: 11/02/2013 Document Reviewed: 07/07/2013 [facility] Patient Information 2015 [place], [place]. This information is not intended to replace advice given to you by your health care provider. Make sure you discuss any questions you have with  your health care provider.

## 2014-05-13 NOTE — ED Provider Notes (Signed)
CSN: [id number]     Arrival date & time 05/13/14  1528 History   First MD Initiated Contact with Patient 05/13/14 1646     Chief Complaint  Patient presents with  . Abdominal Pain   HPI  Teresa Beck is a 28 y.o. female with a PMH of ovarian cysts who presents to the ED for evaluation of abdominal pain. History was provided by the patient. Patient states that she has had unchanged lower abdominal pain (R>L) with radiation towards her right lower back for the past two weeks. Pain is a constant stabbing pain. Pain is worse with urination and bowel movements. No similar pain in the past. She took a Tylenol PTA with no improvements in her pain. She also has had constipation with her last bowel movement this morning. Also nausea with one episode of emesis this morning. She also has had dyspareunia. No vaginal discharge or bleeding, genital sores, or vaginal pain. She denies any new sexual partners or concerns for STD's. She denies any fever, chills, change in appetite/activity, chest pain, SOB, weakness, headache, dizziness, or other concerns. No previous abdominal surgeries.     Past Medical History  Diagnosis Date  . Ovarian cyst    History reviewed. No pertinent past surgical history. No family history on file. History  Substance Use Topics  . Smoking status: Never Smoker   . Smokeless tobacco: Not on file  . Alcohol Use: Yes   OB History   Grav Para Term Preterm Abortions TAB SAB Ect Mult Living                  Review of Systems  Constitutional: Negative for fever, chills, activity change, appetite change and fatigue.  HENT: Negative for congestion, rhinorrhea and sore throat.   Respiratory: Negative for cough and shortness of breath.   Gastrointestinal: Positive for nausea, vomiting, abdominal pain and constipation. Negative for diarrhea, blood in stool, anal bleeding and rectal pain.  Genitourinary: Positive for dysuria, flank pain, pelvic pain and dyspareunia. Negative for  urgency, frequency, hematuria, decreased urine volume, vaginal bleeding, difficulty urinating, vaginal pain and menstrual problem.  Musculoskeletal: Positive for back pain. Negative for myalgias and neck pain.  Skin: Negative for rash and wound.  Neurological: Negative for dizziness, weakness, light-headedness and headaches.    Allergies  Review of patient's allergies indicates no known allergies.  Home Medications   Prior to Admission medications   Medication Sig Start Date End Date Taking? Authorizing Provider  acetaminophen (TYLENOL) 325 MG tablet Take 650 mg by mouth every 6 (six) hours as needed (pain).   Yes Historical Provider, MD   BP 122/67  Pulse 57  Temp(Src) 98.5 F (36.9 C) (Oral)  Resp 18  SpO2 100%  LMP 04/29/2014  Filed Vitals:   05/13/14 1542 05/13/14 2113  BP: 122/67 98/55  Pulse: 57 71  Temp: 98.5 F (36.9 C) 98.2 F (36.8 C)  TempSrc: Oral Oral  Resp: 18 17  SpO2: 100% 100%    Physical Exam  Nursing note and vitals reviewed. Constitutional: She is oriented to person, place, and time. She appears well-developed and well-nourished. No distress.  HENT:  Head: Normocephalic and atraumatic.  Right Ear: External ear normal.  Left Ear: External ear normal.  Nose: Nose normal.  Mouth/Throat: Oropharynx is clear and moist. No oropharyngeal exudate.  Eyes: Conjunctivae are normal. Right eye exhibits no discharge. Left eye exhibits no discharge.  Neck: Normal range of motion. Neck supple.  Cardiovascular: Normal rate, regular rhythm and  normal heart sounds.  Exam reveals no gallop and no friction rub.   No murmur heard. Pulmonary/Chest: Effort normal and breath sounds normal. No respiratory distress. She has no wheezes. She has no rales. She exhibits no tenderness.  Abdominal: Soft. Bowel sounds are normal. She exhibits no distension and no mass. There is tenderness. There is no rebound and no guarding.  Diffuse tenderness to palpation to the lower abdomen  (R>L). Tenderness to palpation to the right flank and right lower lumbar paraspinal muscles.   Genitourinary:  Minimal amount of thin white vaginal discharge. No vaginal bleeding. No CMT or left adnexal tenderness. Tenderness to palpation to the right adnexa. No palpable masses. No vaginal bleeding.   Musculoskeletal: Normal range of motion. She exhibits tenderness. She exhibits no edema.  Neurological: She is alert and oriented to person, place, and time.  Skin: Skin is warm and dry. She is not diaphoretic.     ED Course  Procedures (including critical care time) Labs Review Labs Reviewed  URINALYSIS, ROUTINE W REFLEX MICROSCOPIC  CBC WITH DIFFERENTIAL  COMPREHENSIVE METABOLIC PANEL  POC URINE PREG, ED    Imaging Review US Transvaginal Non-ob  05/13/2014   CLINICAL DATA:  Abdominal pain with history of ovarian cyst.  EXAM: TRANSABDOMINAL AND TRANSVAGINAL ULTRASOUND OF PELVIS  DOPPLER ULTRASOUND OF OVARIES  TECHNIQUE: Both transabdominal and transvaginal ultrasound examinations of the pelvis were performed. Transabdominal technique was performed for global imaging of the pelvis including uterus, ovaries, adnexal regions, and pelvic cul-de-sac.  Color and duplex Doppler ultrasound was utilized to evaluate blood flow to the ovaries.  COMPARISON:  Pelvic ultrasound of August 08, 2013.  FINDINGS: Uterus  Measurements: 7.3 x 5.3 x 4.0 cm. No fibroids or other mass visualized.  Endometrium  Thickness: 8 mm.  No focal abnormality visualized.  Right ovary  Measurements: 5.4 x 4.4 x 5.6 cm. There is a simple appearing cyst measuring 4.6 x 3.6 x 4.6 cm.  Left ovary  Measurements: 3.8 x 2.7 x 2.1 cm. There is a simple appearing cyst measuring 1.3 x 1.7 x 1.7 cm.  Pulsed Doppler evaluation of both ovaries demonstrates normal low-resistance arterial and venous waveforms.  Other findings  No free fluid.  IMPRESSION: 1. The uterus is normal. 2. There are simple appearing cysts in both ovaries. The largest  lies on the right and measures 4.6 cm in greatest dimension. A cyst was described here previously which measured 3.2 cm. This is almost certainly benign, and no specific imaging follow up is recommended according to the Society of Radiologists in Ultrasound 2010 Consensus Conference Statement (D Lenis Noon et al. Management of Asymptomatic Ovarian and Other Adnexal Cysts Imaged at [place]: Society of Radiologists in Ultrasound Consensus Conference Statement 2010. Radiology 256 (Sept 2010): 943-954.). 3. Doppler evaluation of the ovaries is normal.   Electronically Signed   By: David  [place]   On: 05/13/2014 20:23   US Pelvis Complete  05/13/2014   CLINICAL DATA:  Abdominal pain with history of ovarian cyst.  EXAM: TRANSABDOMINAL AND TRANSVAGINAL ULTRASOUND OF PELVIS  DOPPLER ULTRASOUND OF OVARIES  TECHNIQUE: Both transabdominal and transvaginal ultrasound examinations of the pelvis were performed. Transabdominal technique was performed for global imaging of the pelvis including uterus, ovaries, adnexal regions, and pelvic cul-de-sac.  Color and duplex Doppler ultrasound was utilized to evaluate blood flow to the ovaries.  COMPARISON:  Pelvic ultrasound of August 08, 2013.  FINDINGS: Uterus  Measurements: 7.3 x 5.3 x 4.0 cm. No fibroids or other mass visualized.  Endometrium  Thickness: 8 mm.  No focal abnormality visualized.  Right ovary  Measurements: 5.4 x 4.4 x 5.6 cm. There is a simple appearing cyst measuring 4.6 x 3.6 x 4.6 cm.  Left ovary  Measurements: 3.8 x 2.7 x 2.1 cm. There is a simple appearing cyst measuring 1.3 x 1.7 x 1.7 cm.  Pulsed Doppler evaluation of both ovaries demonstrates normal low-resistance arterial and venous waveforms.  Other findings  No free fluid.  IMPRESSION: 1. The uterus is normal. 2. There are simple appearing cysts in both ovaries. The largest lies on the right and measures 4.6 cm in greatest dimension. A cyst was described here previously which measured 3.2 cm. This is almost  certainly benign, and no specific imaging follow up is recommended according to the Society of Radiologists in Ultrasound 2010 Consensus Conference Statement (D Lenis NoonLevine et al. Management of Asymptomatic Ovarian and Other Adnexal Cysts Imaged at [place]: Society of Radiologists in Ultrasound Consensus Conference Statement 2010. Radiology 256 (Sept 2010): 943-954.). 3. Doppler evaluation of the ovaries is normal.   Electronically Signed   By: David  [place]   On: 05/13/2014 20:23   [place] Art/ven Flow Abd Pelv Doppler  05/13/2014   CLINICAL DATA:  Abdominal pain with history of ovarian cyst.  EXAM: TRANSABDOMINAL AND TRANSVAGINAL ULTRASOUND OF PELVIS  DOPPLER ULTRASOUND OF OVARIES  TECHNIQUE: Both transabdominal and transvaginal ultrasound examinations of the pelvis were performed. Transabdominal technique was performed for global imaging of the pelvis including uterus, ovaries, adnexal regions, and pelvic cul-de-sac.  Color and duplex Doppler ultrasound was utilized to evaluate blood flow to the ovaries.  COMPARISON:  Pelvic ultrasound of August 08, 2013.  FINDINGS: Uterus  Measurements: 7.3 x 5.3 x 4.0 cm. No fibroids or other mass visualized.  Endometrium  Thickness: 8 mm.  No focal abnormality visualized.  Right ovary  Measurements: 5.4 x 4.4 x 5.6 cm. There is a simple appearing cyst measuring 4.6 x 3.6 x 4.6 cm.  Left ovary  Measurements: 3.8 x 2.7 x 2.1 cm. There is a simple appearing cyst measuring 1.3 x 1.7 x 1.7 cm.  Pulsed Doppler evaluation of both ovaries demonstrates normal low-resistance arterial and venous waveforms.  Other findings  No free fluid.  IMPRESSION: 1. The uterus is normal. 2. There are simple appearing cysts in both ovaries. The largest lies on the right and measures 4.6 cm in greatest dimension. A cyst was described here previously which measured 3.2 cm. This is almost certainly benign, and no specific imaging follow up is recommended according to the Society of Radiologists in Ultrasound 2010  Consensus Conference Statement (D Lenis NoonLevine et al. Management of Asymptomatic Ovarian and Other Adnexal Cysts Imaged at [place]: Society of Radiologists in Ultrasound Consensus Conference Statement 2010. Radiology 256 (Sept 2010): 943-954.). 3. Doppler evaluation of the ovaries is normal.   Electronically Signed   By: David  [place]   On: 05/13/2014 20:23     EKG Interpretation None      Results for orders placed during the hospital encounter of 05/13/14  WET PREP, GENITAL      Result Value Ref Range   Yeast Wet Prep HPF POC NONE SEEN  NONE SEEN   Trich, Wet Prep NONE SEEN  NONE SEEN   Clue Cells Wet Prep HPF POC FEW (*) NONE SEEN   WBC, Wet Prep HPF POC FEW (*) NONE SEEN  CBC WITH DIFFERENTIAL      Result Value Ref Range   WBC 9.9  4.0 -  10.5 K/uL   RBC 4.32  3.87 - 5.11 MIL/uL   Hemoglobin 12.6  12.0 - 15.0 g/dL   HCT 29.5  62.1 - 30.8 %   MCV 87.0  78.0 - 100.0 fL   MCH 29.2  26.0 - 34.0 pg   MCHC 33.5  30.0 - 36.0 g/dL   RDW 65.7  84.6 - 96.2 %   Platelets 297  150 - 400 K/uL   Neutrophils Relative % 61  43 - 77 %   Neutro Abs 6.0  1.7 - 7.7 K/uL   Lymphocytes Relative 31  12 - 46 %   Lymphs Abs 3.1  0.7 - 4.0 K/uL   Monocytes Relative 7  3 - 12 %   Monocytes Absolute 0.7  0.1 - 1.0 K/uL   Eosinophils Relative 1  0 - 5 %   Eosinophils Absolute 0.1  0.0 - 0.7 K/uL   Basophils Relative 0  0 - 1 %   Basophils Absolute 0.0  0.0 - 0.1 K/uL  COMPREHENSIVE METABOLIC PANEL      Result Value Ref Range   Sodium 137  137 - 147 mEq/L   Potassium 4.4  3.7 - 5.3 mEq/L   Chloride 101  96 - 112 mEq/L   CO2 25  19 - 32 mEq/L   Glucose, Bld 88  70 - 99 mg/dL   BUN 13  6 - 23 mg/dL   Creatinine, Ser 9.52  0.50 - 1.10 mg/dL   Calcium 9.8  8.4 - 84.1 mg/dL   Total Protein 8.4 (*) 6.0 - 8.3 g/dL   Albumin 3.9  3.5 - 5.2 g/dL   AST 10  0 - 37 U/L   ALT 10  0 - 35 U/L   Alkaline Phosphatase 83  39 - 117 U/L   Total Bilirubin 0.2 (*) 0.3 - 1.2 mg/dL   GFR calc non Af Amer >90  >90 mL/min   GFR  calc Af Amer >90  >90 mL/min   Anion gap 11  5 - 15  URINALYSIS, ROUTINE W REFLEX MICROSCOPIC      Result Value Ref Range   Color, Urine YELLOW  YELLOW   APPearance CLEAR  CLEAR   Specific Gravity, Urine 1.011  1.005 - 1.030   pH 6.0  5.0 - 8.0   Glucose, UA NEGATIVE  NEGATIVE mg/dL   Hgb urine dipstick NEGATIVE  NEGATIVE   Bilirubin Urine NEGATIVE  NEGATIVE   Ketones, ur NEGATIVE  NEGATIVE mg/dL   Protein, ur NEGATIVE  NEGATIVE mg/dL   Urobilinogen, UA 0.2  0.0 - 1.0 mg/dL   Nitrite NEGATIVE  NEGATIVE   Leukocytes, UA NEGATIVE  NEGATIVE  POC URINE PREG, ED      Result Value Ref Range   Preg Test, Ur NEGATIVE  NEGATIVE     MDM   Teresa Beck is a 28 y.o. female with a PMH of ovarian cysts who presents to the ED for evaluation of abdominal pain. Etiology of abdominal/pelvic pain likely due to bilateral ovarian cysts. Pelvic US shows bilateral cysts with no other acute abnormalities. Appendicitis unlikely as her pain is unchanged for the past two weeks. Abdominal exam benign. UA negative for UTI or hematuria. Labs unremarkable. Pelvic exam unremarkable. Vital signs stable. Instructed patient to follow-up with women's health. Return precautions, discharge instructions, and follow-up was discussed with the patient before discharge.    Rechecks  6:40 PM = Pain is a 6/10. Ordering another 4 mg morphine.  9:00 PM =  Pain controlled. Ready for discharge.     New Prescriptions   HYDROCODONE-ACETAMINOPHEN (NORCO/VICODIN) 5-325 MG PER TABLET    Take 1-2 tablets by mouth every 4 (four) hours as needed for moderate pain or severe pain.    Final impressions: 1. Cysts of both ovaries       Greer EeJessica Katlin Lyle Leisner PA-C            Jillyn LedgerJessica K Andera Beck, [place] JerseyPA-C 05/13/14 2311

## 2014-05-14 LAB — GC/CHLAMYDIA PROBE AMP
CT Probe RNA: POSITIVE — AB
GC Probe RNA: NEGATIVE

## 2014-05-20 ENCOUNTER — Telehealth (HOSPITAL_BASED_OUTPATIENT_CLINIC_OR_DEPARTMENT_OTHER): Payer: Self-pay | Admitting: Emergency Medicine

## 2014-05-27 ENCOUNTER — Telehealth (HOSPITAL_BASED_OUTPATIENT_CLINIC_OR_DEPARTMENT_OTHER): Payer: Self-pay

## 2014-05-27 NOTE — Telephone Encounter (Signed)
Chart reviewed by Dr Zammit "Pt needs 1 gram Zithromax Teresa Beck or Doxycycline 100 mg BID for 7 days.  Pt needs to f/u w/PCP" 7/17 @ 2126 Phone was answered then hung up after asking to speak to pt.  Unable to reach x 4.  [facility]

## 2014-07-14 ENCOUNTER — Telehealth (HOSPITAL_COMMUNITY): Payer: Self-pay

## 2014-07-14 NOTE — ED Notes (Signed)
Unable to contact pt by mail or telephone. Unable to communicate lab results or treatment changes. 

## 2014-11-11 NOTE — L&D Delivery Note (Signed)
Patient is 29 y.o. [id number] 6838w2d admitted for PPROM- dating at that time placed the patient at 6675w5d. Patient was seen in MAU for vaginal bleeding and leaking of fluid. She reported lower abdominal pain/cramping every 30 minutes that was not painful and resolved in MAU. Work up for leaking fluid showed low AFI of 3.2 (which was grossly decreased from prior AFI on 12/5). Patient was admitted for presumptive PPROM and latency antibiotics. Admission was discussed with MFM and NICU. Betamethasone for fetal lung maturity was administrated at 2000. Approximately 1 hr after arrival to antenatal unit she started having contractions every 10 minutes that were painful and these were able to be both palpated and traced on toco. She was transferred to birthing suites and [facility]. The patient was about to receive terbutaline and got up to go to the bathroom and precipitously and unexpectedly delivered the infant while in the bathroom.  Code AGPAR was called and NICU arrived. Infant was resuscitated in the room and brought up to NICU.  Umbilical cord clamp was placed on cord at perineum and pitocin bolus started.  Cytotec 800mg  oral was given to facilitate delivery of placenta.  Placenta delivered at 0107 and was intact. Placenta sent to pathology  Delivery Note At 12:12 AM a viable female was delivered via Vaginal, Spontaneous Delivery while on the toilet, unwitnessed.  APGAR at 5 and 10min were 8 ,8 ; weight- pending .   Placenta status: spontaneous, intact at 0107. Cord: 3 vessels with the following complications: PPROM, delayed delivery of placenta  Anesthesia: None  Episiotomy: None Lacerations:  None Suture Repair: na Est. Blood Loss (mL):  100  Mom to postpartum.  Baby to NICU.  Isa RankinKimberly Niles [facility] 10/29/2015, 12:51 AM

## 2015-07-03 ENCOUNTER — Encounter (HOSPITAL_COMMUNITY): Payer: Self-pay | Admitting: *Deleted

## 2015-07-03 ENCOUNTER — Inpatient Hospital Stay (HOSPITAL_COMMUNITY): Payer: Self-pay

## 2015-07-03 ENCOUNTER — Inpatient Hospital Stay (HOSPITAL_COMMUNITY)
Admission: AD | Admit: 2015-07-03 | Discharge: 2015-07-03 | Disposition: A | Discharge status: Home / Self Care | Payer: Self-pay | Source: Ambulatory Visit | Attending: Family Medicine | Admitting: Family Medicine

## 2015-07-03 DIAGNOSIS — O209 Hemorrhage in early pregnancy, unspecified: Secondary | ICD-10-CM | POA: Insufficient documentation

## 2015-07-03 DIAGNOSIS — Z3A01 Less than 8 weeks gestation of pregnancy: Secondary | ICD-10-CM | POA: Insufficient documentation

## 2015-07-03 DIAGNOSIS — O4691 Antepartum hemorrhage, unspecified, first trimester: Secondary | ICD-10-CM

## 2015-07-03 DIAGNOSIS — O26899 Other specified pregnancy related conditions, unspecified trimester: Secondary | ICD-10-CM

## 2015-07-03 DIAGNOSIS — R109 Unspecified abdominal pain: Secondary | ICD-10-CM

## 2015-07-03 HISTORY — DX: Chronic kidney disease, unspecified: N18.9

## 2015-07-03 LAB — CBC
HCT: 32.9 % — ABNORMAL LOW (ref 36.0–46.0)
HEMOGLOBIN: 11.1 g/dL — AB (ref 12.0–15.0)
MCH: 30.3 pg (ref 26.0–34.0)
MCHC: 33.7 g/dL (ref 30.0–36.0)
MCV: 89.9 fL (ref 78.0–100.0)
Platelets: 271 10*3/uL (ref 150–400)
RBC: 3.66 MIL/uL — ABNORMAL LOW (ref 3.87–5.11)
RDW: 14.7 % (ref 11.5–15.5)
WBC: 10 10*3/uL (ref 4.0–10.5)

## 2015-07-03 LAB — URINALYSIS, ROUTINE W REFLEX MICROSCOPIC
Bilirubin Urine: NEGATIVE
GLUCOSE, UA: NEGATIVE mg/dL
Hgb urine dipstick: NEGATIVE
KETONES UR: NEGATIVE mg/dL
LEUKOCYTES UA: NEGATIVE
NITRITE: NEGATIVE
PH: 5.5 (ref 5.0–8.0)
Protein, ur: NEGATIVE mg/dL
SPECIFIC GRAVITY, URINE: 1.025 (ref 1.005–1.030)
Urobilinogen, UA: 0.2 mg/dL (ref 0.0–1.0)

## 2015-07-03 LAB — POCT PREGNANCY, URINE: Preg Test, Ur: POSITIVE — AB

## 2015-07-03 LAB — WET PREP, GENITAL
Clue Cells Wet Prep HPF POC: NONE SEEN
TRICH WET PREP: NONE SEEN
Yeast Wet Prep HPF POC: NONE SEEN

## 2015-07-03 LAB — HCG, QUANTITATIVE, PREGNANCY: hCG, Beta Chain, Quant, S: 76338 m[IU]/mL — ABNORMAL HIGH (ref <5)

## 2015-07-03 LAB — ABO/RH: ABO/RH(D): A POS

## 2015-07-03 NOTE — MAU Note (Signed)
States [redacted] weeks pregnant, began light spotting with abd pain yesterday, bleeding has lessened today

## 2015-07-03 NOTE — MAU Provider Note (Signed)
History     CSN: [id number]  Arrival date and time: 07/03/15 1533   First Provider Initiated Contact with Patient 07/03/15 1729      Chief Complaint  Patient presents with  . Abdominal Pain   HPI    [name] is a 29 y.o. female G3P2002 at [redacted]w[redacted]d presenting to MAU with vaginal bleeding and abdominal pain.   Abdominal pain: the pain started 3 days ago. The pain is located in the center of her lower abdomen. The pain is constant. The patient has not taken anything for the pain.   Vaginal bleeding: The bleeding started yesterday morning. The bleeding is pink in color, the patient has had to wear a pad and notices it more when she wipes. No recent intercourse.   She has not been seen for this pregnancy thus far.   She had chlamydia 3 months ago; her and her partner were both treated. She would like to be tested again today.   OB History    Gravida Para Term Preterm AB TAB SAB Ectopic Multiple Living   1               Past Medical History  Diagnosis Date  . Ovarian cyst     No past surgical history on file.  No family history on file.  Social History  Substance Use Topics  . Smoking status: Never Smoker   . Smokeless tobacco: Not on file  . Alcohol Use: Yes    Allergies: No Known Allergies  Prescriptions prior to admission  Medication Sig Dispense Refill Last Dose  . acetaminophen (TYLENOL) 325 MG tablet Take 650 mg by mouth every 6 (six) hours as needed (pain).   Past Week at Unknown time  . Prenatal Vit-Fe Fumarate-FA (PRENATAL MULTIVITAMIN) TABS tablet Take 1 tablet by mouth daily at 12 noon.   07/03/2015 at Unknown time  . HYDROcodone-acetaminophen (NORCO/VICODIN) 5-325 MG per tablet Take 1-2 tablets by mouth every 4 (four) hours as needed for moderate pain or severe pain. (Patient not taking: Reported on 07/03/2015) 12 tablet 0 Not Taking at Unknown time   Results for orders placed or performed during the hospital encounter of 07/03/15 (from the past 48  hour(s))  Urinalysis, Routine w reflex microscopic (not at [facility])     Status: None   Collection Time: 07/03/15  5:00 PM  Result Value Ref Range   Color, Urine YELLOW YELLOW   APPearance CLEAR CLEAR   Specific Gravity, Urine 1.025 1.005 - 1.030   pH 5.5 5.0 - 8.0   Glucose, UA NEGATIVE NEGATIVE mg/dL   Hgb urine dipstick NEGATIVE NEGATIVE   Bilirubin Urine NEGATIVE NEGATIVE   Ketones, ur NEGATIVE NEGATIVE mg/dL   Protein, ur NEGATIVE NEGATIVE mg/dL   Urobilinogen, UA 0.2 0.0 - 1.0 mg/dL   Nitrite NEGATIVE NEGATIVE   Leukocytes, UA NEGATIVE NEGATIVE    Comment: MICROSCOPIC NOT DONE ON URINES WITH NEGATIVE PROTEIN, BLOOD, LEUKOCYTES, NITRITE, OR GLUCOSE <1000 mg/dL.  Pregnancy, urine POC     Status: Abnormal   Collection Time: 07/03/15  5:37 PM  Result Value Ref Range   Preg Test, Ur POSITIVE (A) NEGATIVE    Comment:        THE SENSITIVITY OF THIS METHODOLOGY IS >24 mIU/mL   Wet prep, genital     Status: Abnormal   Collection Time: 07/03/15  5:40 PM  Result Value Ref Range   Yeast Wet Prep HPF POC NONE SEEN NONE SEEN   Trich, Wet Prep NONE SEEN  NONE SEEN   Clue Cells Wet Prep HPF POC NONE SEEN NONE SEEN   WBC, Wet Prep HPF POC FEW (A) NONE SEEN    Comment: MANY BACTERIA SEEN  CBC     Status: Abnormal   Collection Time: 07/03/15  5:50 PM  Result Value Ref Range   WBC 10.0 4.0 - 10.5 K/uL   RBC 3.66 (L) 3.87 - 5.11 MIL/uL   Hemoglobin 11.1 (L) 12.0 - 15.0 g/dL   HCT 45.4 (L) 09.8 - 11.9 %   MCV 89.9 78.0 - 100.0 fL   MCH 30.3 26.0 - 34.0 pg   MCHC 33.7 30.0 - 36.0 g/dL   RDW 14.7 82.9 - 56.2 %   Platelets 271 150 - 400 K/uL  ABO/Rh     Status: None (Preliminary result)   Collection Time: 07/03/15  5:50 PM  Result Value Ref Range   ABO/RH(D) A POS    US Ob Comp Less 14 Wks  07/03/2015   CLINICAL DATA:  Vaginal bleeding, first trimester of pregnancy.  EXAM: OBSTETRIC <14 WK [place] AND TRANSVAGINAL OB US  TECHNIQUE: Both transabdominal and transvaginal ultrasound examinations  were performed for complete evaluation of the gestation as well as the maternal uterus, adnexal regions, and pelvic cul-de-sac. Transvaginal technique was performed to assess early pregnancy.  COMPARISON:  Ultrasound of May 13, 2014.  FINDINGS: Intrauterine gestational sac: Visualized/normal in shape.  Yolk sac:  Visualized.  Embryo:  Visualized.  Cardiac Activity: Visualized.  Heart Rate: 125  bpm  CRL:  6  mm   6 w   3 d                  [place] [facility]: February 23, 2016.  Maternal uterus/adnexae: Ovaries appear normal. No free fluid is noted.  IMPRESSION: Single live intrauterine gestation of 6 weeks 3 days.   Electronically Signed   By: Lupita Raider, M.D.   On: 07/03/2015 18:39   US Ob Transvaginal  07/03/2015   CLINICAL DATA:  Vaginal bleeding, first trimester of pregnancy.  EXAM: OBSTETRIC <14 WK [place] AND TRANSVAGINAL OB US  TECHNIQUE: Both transabdominal and transvaginal ultrasound examinations were performed for complete evaluation of the gestation as well as the maternal uterus, adnexal regions, and pelvic cul-de-sac. Transvaginal technique was performed to assess early pregnancy.  COMPARISON:  Ultrasound of May 13, 2014.  FINDINGS: Intrauterine gestational sac: Visualized/normal in shape.  Yolk sac:  Visualized.  Embryo:  Visualized.  Cardiac Activity: Visualized.  Heart Rate: 125  bpm  CRL:  6  mm   6 w   3 d                  [place] [facility]: February 23, 2016.  Maternal uterus/adnexae: Ovaries appear normal. No free fluid is noted.  IMPRESSION: Single live intrauterine gestation of 6 weeks 3 days.   Electronically Signed   By: Lupita Raider, M.D.   On: 07/03/2015 18:39    Review of Systems  Constitutional: Positive for chills. Negative for fever.  Gastrointestinal: Positive for nausea, abdominal pain and constipation. Negative for vomiting and diarrhea.  Genitourinary: Positive for urgency and frequency. Negative for dysuria (+ odor to urine. ).   Physical Exam   Blood pressure 127/59, pulse 62, temperature  98.8 F (37.1 C), temperature source Oral, resp. rate 20, last menstrual period 04/05/2015, SpO2 100 %.  Physical Exam  Constitutional: She is oriented to person, place, and time. She appears well-developed and well-nourished. No distress.  HENT:  Head: Normocephalic.  Eyes: Pupils are equal, round, and reactive to light.  Neck: Neck supple.  Respiratory: Effort normal.  GI: Soft. She exhibits no distension and no mass. There is no tenderness. There is no rebound and no guarding.  Genitourinary:  Speculum exam: Vagina - Small amount of creamy, pale yellow discharge, no odor Cervix - No contact bleeding, no active bleeding  Bimanual exam: Cervix closed, no CMT  Uterus non tender, enlarged  Adnexa non tender, no masses bilaterally GC/Chlam, wet prep done Chaperone present for exam.  Musculoskeletal: Normal range of motion.  Neurological: She is alert and oriented to person, place, and time.  Skin: Skin is warm. She is not diaphoretic.  Psychiatric: Her behavior is normal.    MAU Course  Procedures  None  MDM  Unable to doppler fetal heart tones with fetal doppler> will proceed with transvaginal US.   Assessment and Plan   A:  1. Abdominal pain in pregnancy   2. Vaginal bleeding in pregnancy, first trimester   3.      SIUP @ [redacted]w[redacted]d  P:  Discharge home in stable condition Start prenatal care ASAP First trimester warning signs  Return to MAU if symptoms worsen   Duane Lope, NP 07/03/2015 5:32 PM

## 2015-07-03 NOTE — Discharge Instructions (Signed)

## 2015-07-04 LAB — GC/CHLAMYDIA PROBE AMP (~~LOC~~) NOT AT ARMC
CHLAMYDIA, DNA PROBE: NEGATIVE
Neisseria Gonorrhea: NEGATIVE

## 2015-07-04 LAB — HIV ANTIBODY (ROUTINE TESTING W REFLEX): HIV Screen 4th Generation wRfx: NONREACTIVE

## 2015-08-14 ENCOUNTER — Encounter (HOSPITAL_COMMUNITY): Payer: Self-pay | Admitting: *Deleted

## 2015-08-14 ENCOUNTER — Emergency Department (HOSPITAL_COMMUNITY)
Admission: EM | Admit: 2015-08-14 | Discharge: 2015-08-15 | Disposition: A | Discharge status: Home / Self Care | Payer: Self-pay | Attending: Emergency Medicine | Admitting: Emergency Medicine

## 2015-08-14 DIAGNOSIS — Z79899 Other long term (current) drug therapy: Secondary | ICD-10-CM | POA: Insufficient documentation

## 2015-08-14 DIAGNOSIS — Z3A09 9 weeks gestation of pregnancy: Secondary | ICD-10-CM | POA: Insufficient documentation

## 2015-08-14 DIAGNOSIS — N189 Chronic kidney disease, unspecified: Secondary | ICD-10-CM | POA: Insufficient documentation

## 2015-08-14 DIAGNOSIS — R8271 Bacteriuria: Secondary | ICD-10-CM

## 2015-08-14 DIAGNOSIS — O9989 Other specified diseases and conditions complicating pregnancy, childbirth and the puerperium: Secondary | ICD-10-CM | POA: Insufficient documentation

## 2015-08-14 DIAGNOSIS — O2341 Unspecified infection of urinary tract in pregnancy, first trimester: Secondary | ICD-10-CM | POA: Insufficient documentation

## 2015-08-14 DIAGNOSIS — R1030 Lower abdominal pain, unspecified: Secondary | ICD-10-CM

## 2015-08-14 DIAGNOSIS — O26831 Pregnancy related renal disease, first trimester: Secondary | ICD-10-CM | POA: Insufficient documentation

## 2015-08-14 LAB — URINALYSIS, ROUTINE W REFLEX MICROSCOPIC
BILIRUBIN URINE: NEGATIVE
Glucose, UA: NEGATIVE mg/dL
Hgb urine dipstick: NEGATIVE
KETONES UR: NEGATIVE mg/dL
NITRITE: NEGATIVE
PH: 7 (ref 5.0–8.0)
Protein, ur: NEGATIVE mg/dL
Specific Gravity, Urine: 1.012 (ref 1.005–1.030)
UROBILINOGEN UA: 0.2 mg/dL (ref 0.0–1.0)

## 2015-08-14 LAB — URINE MICROSCOPIC-ADD ON

## 2015-08-14 MED ORDER — KETOROLAC TROMETHAMINE 60 MG/2ML IM SOLN: 60.0000 mg | Freq: Once | INTRAMUSCULAR | Status: DC

## 2015-08-14 MED ORDER — OXYCODONE-ACETAMINOPHEN 5-325 MG PO TABS: 2.0000 | ORAL_TABLET | Freq: Once | ORAL | Status: DC

## 2015-08-14 MED ORDER — SODIUM CHLORIDE 0.9 % IV BOLUS (SEPSIS)
1000.0000 mL | Freq: Once | INTRAVENOUS | Status: AC
  Administered 2015-08-14: 1000 mL via INTRAVENOUS

## 2015-08-14 MED ORDER — NITROFURANTOIN MONOHYD MACRO 100 MG PO CAPS: 100.0000 mg | ORAL_CAPSULE | Freq: Two times a day (BID) | ORAL | Status: DC

## 2015-08-14 MED ORDER — ONDANSETRON HCL 4 MG/2ML IJ SOLN
4.0000 mg | Freq: Once | INTRAMUSCULAR | Status: AC
  Administered 2015-08-14: 4 mg via INTRAVENOUS
  Filled 2015-08-14: qty 2

## 2015-08-14 NOTE — Discharge Instructions (Signed)

## 2015-08-14 NOTE — ED Provider Notes (Signed)
CSN: [id number]     Arrival date & time 08/14/15  2049 History   First MD Initiated Contact with Patient 08/14/15 2240     Chief Complaint  Patient presents with  . Flank Pain     (Consider location/radiation/quality/duration/timing/severity/associated sxs/prior Treatment) HPI Comments: The pt is a 29 year old female, she is 3 months pregnant, has seen the OB/GYN and has been diagnosed with a normal pregnancy based on early ultrasound. This is a third pregnancy. She has had 3 days of progressive dysuria, frequency and flank pain with nausea and one episode of vomiting. She has had subjective fevers but after she takes Tylenol her symptoms go away. She has had urine infections in the past.  Patient is a 29 y.o. female presenting with flank pain. The history is provided by the patient.  Flank Pain    Past Medical History  Diagnosis Date  . Ovarian cyst   . Chronic kidney disease     stones and infection   Past Surgical History  Procedure Laterality Date  . No past surgeries     No family history on file. Social History  Substance Use Topics  . Smoking status: Never Smoker   . Smokeless tobacco: None  . Alcohol Use: Yes     Comment: not while pregnant   OB History    Gravida Para Term Preterm AB TAB SAB Ectopic Multiple Living   0 0 0 0 0 0 2     Review of Systems  Genitourinary: Positive for flank pain.  All other systems reviewed and are negative.     Allergies  Review of patient's allergies indicates no known allergies.  Home Medications   Prior to Admission medications   Medication Sig Start Date End Date Taking? Authorizing Provider  acetaminophen (TYLENOL) 325 MG tablet Take 650 mg by mouth every 6 (six) hours as needed (pain).   Yes Historical Provider, MD  Prenatal Vit-Fe Fumarate-FA (PRENATAL MULTIVITAMIN) TABS tablet Take 1 tablet by mouth daily at 12 noon.   Yes Historical Provider, MD  nitrofurantoin, macrocrystal-monohydrate, (MACROBID) 100 MG  capsule Take 1 capsule (100 mg total) by mouth 2 (two) times daily. 08/14/15   Eber Hong, MD   BP 112/51 mmHg  Pulse 68  Temp(Src) 98.2 F (36.8 C) (Oral)  Resp 16  SpO2 100%  LMP 05/28/2015 Physical Exam  Constitutional: She appears well-developed and well-nourished. No distress.  HENT:  Head: Normocephalic and atraumatic.  Mouth/Throat: Oropharynx is clear and moist. No oropharyngeal exudate.  Eyes: Conjunctivae and EOM are normal. Pupils are equal, round, and reactive to light. Right eye exhibits no discharge. Left eye exhibits no discharge. No scleral icterus.  Neck: Normal range of motion. Neck supple. No JVD present. No thyromegaly present.  Cardiovascular: Normal rate, regular rhythm, normal heart sounds and intact distal pulses.  Exam reveals no gallop and no friction rub.   No murmur heard. Pulmonary/Chest: Effort normal and breath sounds normal. No respiratory distress. She has no wheezes. She has no rales.  Abdominal: Soft. Bowel sounds are normal. She exhibits no distension and no mass. There is tenderness (suprapubic).  Musculoskeletal: Normal range of motion. She exhibits no edema or tenderness.  Lymphadenopathy:    She has no cervical adenopathy.  Neurological: She is alert. Coordination normal.  Skin: Skin is warm and dry. No rash noted. No erythema.  Psychiatric: She has a normal mood and affect. Her behavior is normal.  Nursing note and vitals reviewed.   ED Course  Procedures (including critical care time) Labs Review Labs Reviewed  URINALYSIS, ROUTINE W REFLEX MICROSCOPIC (NOT AT [facility]) - Abnormal; Notable for the following:    APPearance CLOUDY (*)    Leukocytes, UA TRACE (*)    All other components within normal limits  URINE MICROSCOPIC-ADD ON - Abnormal; Notable for the following:    Squamous Epithelial / LPF FEW (*)    Bacteria, UA FEW (*)    All other components within normal limits  POC URINE PREG, ED    Imaging Review No results found. I have  personally reviewed and evaluated these images and lab results as part of my medical decision-making.    MDM   Final diagnoses:  Bacteriuria  Lower abdominal pain    Vital signs normal, no obvious CVA tenderness, minimal suprapubic tenderness, very soft abdomen with no guarding, no fever, no tachycardia. She has had nausea and flank pain, this is all consistent with potential pyelonephritis, otherwise she appears well and should be amenable to IV fluids, IV antibiotics.  Improved with medications, urinalysis shows bacteriuria, antibiotics will be given, repeat abdominal exam shows more of a diffuse abdominal discomfort, there is no guarding, she has a very soft abdomen, it is primarily periumbilical and suprapubic, this is likely related to her pregnancy, doubt appendicitis, doubt cholecystitis as she has no postprandial pain.  I have referred the patient to come back to the hospital should her symptoms worsen, otherwise she needs a follow-up evaluation within 2 days at her doctor's office, she has agreed to this plan  Meds given in ED:  Medications  sodium chloride 0.9 % bolus 1,000 mL (1,000 mLs Intravenous New Bag/Given 08/14/15 2320)  ondansetron (ZOFRAN) injection 4 mg (4 mg Intravenous Given 08/14/15 2320)    New Prescriptions   NITROFURANTOIN, MACROCRYSTAL-MONOHYDRATE, (MACROBID) 100 MG CAPSULE    Take 1 capsule (100 mg total) by mouth 2 (two) times daily.    Filed Vitals:   08/14/15 2058 08/14/15 2238 08/14/15 2318  BP: 107/62 108/60 112/51  Pulse: 60 61 68  Temp: 97.9 F (36.6 C) 98.2 F (36.8 C)   TempSrc: Oral Oral   Resp: SpO2: 97% 100% 100%     Eber Hong, MD 08/15/15 0001

## 2015-08-14 NOTE — ED Notes (Signed)
Pt states that she began having right flank pain and lower abd pain that began yesterday; pt states that it burns when she urinates; pt states that she has a headache and "just doesn't feel right"; pt c/o urinary frequency and urgency

## 2015-08-14 NOTE — ED Notes (Signed)
MD at bedside. 

## 2015-08-15 LAB — POC URINE PREG, ED: PREG TEST UR: POSITIVE — AB

## 2015-08-16 ENCOUNTER — Inpatient Hospital Stay (HOSPITAL_COMMUNITY)
Admission: AD | Admit: 2015-08-16 | Discharge: 2015-08-16 | Disposition: A | Discharge status: Home / Self Care | Payer: Self-pay | Source: Ambulatory Visit | Attending: Obstetrics & Gynecology | Admitting: Obstetrics & Gynecology

## 2015-08-16 ENCOUNTER — Encounter (HOSPITAL_COMMUNITY): Payer: Self-pay | Admitting: *Deleted

## 2015-08-16 DIAGNOSIS — N189 Chronic kidney disease, unspecified: Secondary | ICD-10-CM | POA: Insufficient documentation

## 2015-08-16 DIAGNOSIS — O2342 Unspecified infection of urinary tract in pregnancy, second trimester: Secondary | ICD-10-CM

## 2015-08-16 DIAGNOSIS — O2341 Unspecified infection of urinary tract in pregnancy, first trimester: Secondary | ICD-10-CM | POA: Insufficient documentation

## 2015-08-16 DIAGNOSIS — Z3A12 12 weeks gestation of pregnancy: Secondary | ICD-10-CM | POA: Insufficient documentation

## 2015-08-16 LAB — CBC
HEMATOCRIT: 31.9 % — AB (ref 36.0–46.0)
HEMOGLOBIN: 11 g/dL — AB (ref 12.0–15.0)
MCH: 31 pg (ref 26.0–34.0)
MCHC: 34.5 g/dL (ref 30.0–36.0)
MCV: 89.9 fL (ref 78.0–100.0)
Platelets: 224 10*3/uL (ref 150–400)
RBC: 3.55 MIL/uL — AB (ref 3.87–5.11)
RDW: 14.3 % (ref 11.5–15.5)
WBC: 9.4 10*3/uL (ref 4.0–10.5)

## 2015-08-16 LAB — URINALYSIS, ROUTINE W REFLEX MICROSCOPIC
BILIRUBIN URINE: NEGATIVE
Glucose, UA: NEGATIVE mg/dL
Hgb urine dipstick: NEGATIVE
Ketones, ur: NEGATIVE mg/dL
LEUKOCYTES UA: NEGATIVE
NITRITE: NEGATIVE
Protein, ur: NEGATIVE mg/dL
SPECIFIC GRAVITY, URINE: 1.015 (ref 1.005–1.030)
UROBILINOGEN UA: 0.2 mg/dL (ref 0.0–1.0)
pH: 7 (ref 5.0–8.0)

## 2015-08-16 LAB — WET PREP, GENITAL
Clue Cells Wet Prep HPF POC: NONE SEEN
Trich, Wet Prep: NONE SEEN
Yeast Wet Prep HPF POC: NONE SEEN

## 2015-08-16 MED ORDER — CEFTRIAXONE SODIUM 1 G IJ SOLR
1.0000 g | Freq: Once | INTRAMUSCULAR | Status: AC
  Administered 2015-08-16: 1 g via INTRAMUSCULAR
  Filled 2015-08-16: qty 10

## 2015-08-16 MED ORDER — CYCLOBENZAPRINE HCL 10 MG PO TABS
10.0000 mg | ORAL_TABLET | Freq: Once | ORAL | Status: AC
  Administered 2015-08-16: 10 mg via ORAL
  Filled 2015-08-16: qty 1

## 2015-08-16 MED ORDER — CYCLOBENZAPRINE HCL 10 MG PO TABS: 10.0000 mg | ORAL_TABLET | Freq: Three times a day (TID) | ORAL | Status: DC | PRN

## 2015-08-16 MED ORDER — CEPHALEXIN 500 MG PO CAPS: 500.0000 mg | ORAL_CAPSULE | Freq: Four times a day (QID) | ORAL | Status: DC

## 2015-08-16 NOTE — Discharge Instructions (Signed)
Pregnancy and Urinary Tract Infection  A urinary tract infection (UTI) is a bacterial infection of the urinary tract. Infection of the urinary tract can include the ureters, kidneys (pyelonephritis), bladder (cystitis), and urethra (urethritis). All pregnant women should be screened for bacteria in the urinary tract. Identifying and treating a UTI will decrease the risk of preterm labor and developing more serious infections in both the mother and baby.  CAUSES  Bacteria germs cause almost all UTIs.   RISK FACTORS  Many factors can increase your chances of getting a UTI during pregnancy. These include:  · Having a short urethra.  · Poor toilet and hygiene habits.  · Sexual intercourse.  · Blockage of urine along the urinary tract.  · Problems with the pelvic muscles or nerves.  · Diabetes.  · Obesity.  · Bladder problems after having several children.  · Previous history of UTI.  SIGNS AND SYMPTOMS   · Pain, burning, or a stinging feeling when urinating.  · Suddenly feeling the need to urinate right away (urgency).  · Loss of bladder control (urinary incontinence).  · Frequent urination, more than is common with pregnancy.  · Lower abdominal or back discomfort.  · Cloudy urine.  · Blood in the urine (hematuria).  · Fever.   When the kidneys are infected, the symptoms may be:  · Back pain.  · Flank pain on the right side more so than the left.  · Fever.  · Chills.  · Nausea.  · Vomiting.  DIAGNOSIS   A urinary tract infection is usually diagnosed through urine tests. Additional tests and procedures are sometimes done. These may include:  · Ultrasound exam of the kidneys, ureters, bladder, and urethra.  · Looking in the bladder with a lighted tube (cystoscopy).  TREATMENT  Typically, UTIs can be treated with antibiotic medicines.   HOME CARE INSTRUCTIONS   · Only take over-the-counter or prescription medicines as directed by your health care provider. If you were prescribed antibiotics, take them as directed. Finish  them even if you start to feel better.  · Drink enough fluids to keep your urine clear or pale yellow.  · Do not have sexual intercourse until the infection is gone and your health care provider says it is okay.  · Make sure you are tested for UTIs throughout your pregnancy. These infections often come back.   Preventing a UTI in the Future  · Practice good toilet habits. Always wipe from front to back. Use the tissue only once.  · Do not hold your urine. Empty your bladder as soon as possible when the urge comes.  · Do not douche or use deodorant sprays.  · Wash with soap and warm water around the genital area and the anus.  · Empty your bladder before and after sexual intercourse.  · Wear underwear with a cotton crotch.  · Avoid caffeine and carbonated drinks. They can irritate the bladder.  · Drink cranberry juice or take cranberry pills. This may decrease the risk of getting a UTI.  · Do not drink alcohol.  · Keep all your appointments and tests as scheduled.   SEEK MEDICAL CARE IF:   · Your symptoms get worse.  · You are still having fevers 2 or more days after treatment begins.  · You have a rash.  · You feel that you are having problems with medicines prescribed.  · You have abnormal vaginal discharge.  SEEK IMMEDIATE MEDICAL CARE IF:   · You have back or flank   pain.  · You have chills.  · You have blood in your urine.  · You have nausea and vomiting.  · You have contractions of your uterus.  · You have a gush of fluid from the vagina.  MAKE SURE YOU:  · Understand these instructions.    · Will watch your condition.    · Will get help right away if you are not doing well or get worse.       This information is not intended to replace advice given to you by your health care provider. Make sure you discuss any questions you have with your health care provider.     Document Released: 02/22/2011 Document Revised: 08/18/2013 Document Reviewed: 05/27/2013  Elsevier Interactive Patient Education ©2016 Elsevier  Inc.

## 2015-08-16 NOTE — MAU Note (Signed)
Pt presents to MAU with complaints of lower back pain and brown vaginal discharge. Pt was evaluated at [facility] ED two days ago and was told she had a bladder infection

## 2015-08-16 NOTE — MAU Provider Note (Signed)
Chief Complaint: Back Pain and Vaginal Bleeding   First Provider Initiated Contact with Patient 08/16/15 1834      SUBJECTIVE HPI: Teresa Beck is a 29 y.o. G3P2002 at [redacted]w[redacted]d by LMP who presents to maternity admissions reporting back pain, abdominal pain, and vaginal spotting.  She was diagnosed with UTI in ED on 10/2 and started Macrobid BID 2 days ago.  Then, yesterday, she developed low back pain which was new and noticed light brown vaginal spotting when wiping today.  She has prenatal care with [facility] with an appointment tomorrow.   She denies vaginal itching/burning, urinary symptoms, h/a, dizziness, n/v, or fever/chills.     Back Pain This is a new problem. The current episode started yesterday. The problem occurs constantly. The problem has been gradually worsening since onset. The quality of the pain is described as aching. The pain does not radiate. The pain is moderate. The pain is the same all the time. Associated symptoms include abdominal pain. Pertinent negatives include no chest pain, dysuria, fever, headaches, pelvic pain or weakness. She has tried nothing for the symptoms.  Vaginal Bleeding The patient's pertinent negatives include no pelvic pain or vaginal discharge. Associated symptoms include abdominal pain and back pain. Pertinent negatives include no chills, constipation, diarrhea, dysuria, fever, flank pain, frequency, headaches, nausea or vomiting.    Past Medical History  Diagnosis Date  . Ovarian cyst   . Chronic kidney disease     stones and infection   Past Surgical History  Procedure Laterality Date  . No past surgeries     Social History   Social History  . Marital Status: Single    Spouse Name: N/A  . Number of Children: N/A  . Years of Education: N/A   Occupational History  . Not on file.   Social History Main Topics  . Smoking status: Never Smoker   . Smokeless tobacco: Not on file  . Alcohol Use: Yes     Comment: not while pregnant  . Drug  Use: No  . Sexual Activity: Yes   Other Topics Concern  . Not on file   Social History Narrative   No current facility-administered medications on file prior to encounter.   Current Outpatient Prescriptions on File Prior to Encounter  Medication Sig Dispense Refill  . acetaminophen (TYLENOL) 325 MG tablet Take 650 mg by mouth every 6 (six) hours as needed (pain).    . Prenatal Vit-Fe Fumarate-FA (PRENATAL MULTIVITAMIN) TABS tablet Take 1 tablet by mouth daily at 12 noon.     No Known Allergies  ROS:  Review of Systems  Constitutional: Negative for fever, chills and fatigue.  HENT: Negative for sinus pressure.   Eyes: Negative for photophobia.  Respiratory: Negative for shortness of breath.   Cardiovascular: Negative for chest pain.  Gastrointestinal: Positive for abdominal pain. Negative for nausea, vomiting, diarrhea and constipation.  Genitourinary: Positive for vaginal bleeding. Negative for dysuria, frequency, flank pain, vaginal discharge, difficulty urinating, vaginal pain and pelvic pain.  Musculoskeletal: Positive for back pain. Negative for neck pain.  Neurological: Negative for dizziness, weakness and headaches.  Psychiatric/Behavioral: Negative.      I have reviewed patient's Past Medical Hx, Surgical Hx, Family Hx, Social Hx, medications and allergies.   Physical Exam   Patient Vitals for the past 24 hrs:  BP Temp Pulse Resp  08/16/15 2028 119/62 mmHg - 98 -  08/16/15 1811 (!) 108/54 mmHg 97.4 F (36.3 C) 74 18   Constitutional: Well-developed, well-nourished female in  no acute distress.  Cardiovascular: normal rate Respiratory: normal effort GI: Abd soft, non-tender. Pos BS x 4 MS: Extremities nontender, no edema, normal ROM Neurologic: Alert and oriented x 4.  GU: Neg CVAT.  PELVIC EXAM: Cervix pink, visually closed, without lesion, scant white creamy discharge, some tan/light brown noted with cotton swab, vaginal walls and external genitalia  normal Bimanual exam: Cervix 0/long/high, firm, anterior, neg CMT, uterus nontender, ~12 week size, adnexa without tenderness, enlargement, or mass  FHT 156 by doppler  LAB RESULTS Results for orders placed or performed during the hospital encounter of 08/16/15 (from the past 24 hour(s))  Wet prep, genital     Status: Abnormal   Collection Time: 08/16/15  6:35 PM  Result Value Ref Range   Yeast Wet Prep HPF POC NONE SEEN NONE SEEN   Trich, Wet Prep NONE SEEN NONE SEEN   Clue Cells Wet Prep HPF POC NONE SEEN NONE SEEN   WBC, Wet Prep HPF POC FEW (A) NONE SEEN  CBC     Status: Abnormal   Collection Time: 08/16/15  6:44 PM  Result Value Ref Range   WBC 9.4 4.0 - 10.5 K/uL   RBC 3.55 (L) 3.87 - 5.11 MIL/uL   Hemoglobin 11.0 (L) 12.0 - 15.0 g/dL   HCT 81.1 (L) 91.4 - 78.2 %   MCV 89.9 78.0 - 100.0 fL   MCH 31.0 26.0 - 34.0 pg   MCHC 34.5 30.0 - 36.0 g/dL   RDW 95.6 21.3 - 08.6 %   Platelets 224 150 - 400 K/uL  Urinalysis, Routine w reflex microscopic (not at [facility])     Status: None   Collection Time: 08/16/15  6:45 PM  Result Value Ref Range   Color, Urine YELLOW YELLOW   APPearance CLEAR CLEAR   Specific Gravity, Urine 1.015 1.005 - 1.030   pH 7.0 5.0 - 8.0   Glucose, UA NEGATIVE NEGATIVE mg/dL   Hgb urine dipstick NEGATIVE NEGATIVE   Bilirubin Urine NEGATIVE NEGATIVE   Ketones, ur NEGATIVE NEGATIVE mg/dL   Protein, ur NEGATIVE NEGATIVE mg/dL   Urobilinogen, UA 0.2 0.0 - 1.0 mg/dL   Nitrite NEGATIVE NEGATIVE   Leukocytes, UA NEGATIVE NEGATIVE    --/--/A POS (08/22 1750)  IMAGING No results found.  MAU Management/MDM: Ordered labs and reviewed results.  Consult Dr Despina Hidden to review assessment/labs.  Treatments in MAU included Rocephin 1 g IM and Flexeril 10 mg PO.  Pt reports significant pain relief after Flexeril. Pt stable at time of discharge.  ASSESSMENT 1. UTI in pregnancy, second trimester     PLAN Discharge home Change abx to Keflex QID x 7 days Flexeril 5-10  mg PO Q 6 hours PRN F/U as scheduled at [facility]   Follow-up Information    Follow up with [facility] [place].   Why:  As scheduled   Contact information:   [address] [place] [address] [phone]      Follow up with [facility] [place] [facility]ERN[facility] ADMISS[facility].   Why:  As needed for emergencies   Contact information:   [address] [id number] mc [place] [address] [phone]      Sharen Counter Certified Nurse-Midwife 08/16/2015  9:02 PM

## 2015-08-17 ENCOUNTER — Other Ambulatory Visit (HOSPITAL_COMMUNITY): Payer: Self-pay | Admitting: Nurse Practitioner

## 2015-08-17 DIAGNOSIS — Z3689 Encounter for other specified antenatal screening: Secondary | ICD-10-CM

## 2015-08-17 LAB — OB RESULTS CONSOLE GC/CHLAMYDIA
CHLAMYDIA, DNA PROBE: NEGATIVE
Gonorrhea: NEGATIVE

## 2015-08-17 LAB — GC/CHLAMYDIA PROBE AMP (~~LOC~~) NOT AT ARMC
CHLAMYDIA, DNA PROBE: NEGATIVE
NEISSERIA GONORRHEA: NEGATIVE

## 2015-08-17 LAB — HIV ANTIBODY (ROUTINE TESTING W REFLEX): HIV SCREEN 4TH GENERATION: NONREACTIVE

## 2015-08-17 LAB — OB RESULTS CONSOLE RPR: RPR: NONREACTIVE

## 2015-08-17 LAB — OB RESULTS CONSOLE HEPATITIS B SURFACE ANTIGEN: HEP B S AG: NEGATIVE

## 2015-08-17 LAB — OB RESULTS CONSOLE RUBELLA ANTIBODY, IGM: RUBELLA: IMMUNE

## 2015-08-17 LAB — OB RESULTS CONSOLE HIV ANTIBODY (ROUTINE TESTING): HIV: NONREACTIVE

## 2015-09-12 ENCOUNTER — Other Ambulatory Visit (HOSPITAL_COMMUNITY): Payer: Self-pay | Admitting: Urology

## 2015-09-13 ENCOUNTER — Ambulatory Visit (HOSPITAL_COMMUNITY)
Admission: RE | Admit: 2015-09-13 | Discharge: 2015-09-13 | Disposition: A | Discharge status: Home / Self Care | Payer: Medicaid Other | Source: Ambulatory Visit | Attending: Physician Assistant | Admitting: Physician Assistant

## 2015-09-13 DIAGNOSIS — Z36 Encounter for antenatal screening of mother: Secondary | ICD-10-CM | POA: Diagnosis not present

## 2015-09-13 DIAGNOSIS — O4692 Antepartum hemorrhage, unspecified, second trimester: Secondary | ICD-10-CM | POA: Diagnosis not present

## 2015-09-13 DIAGNOSIS — Z3689 Encounter for other specified antenatal screening: Secondary | ICD-10-CM

## 2015-09-13 DIAGNOSIS — Z3A16 16 weeks gestation of pregnancy: Secondary | ICD-10-CM | POA: Diagnosis not present

## 2015-10-04 ENCOUNTER — Ambulatory Visit (HOSPITAL_COMMUNITY): Payer: Self-pay

## 2015-10-16 ENCOUNTER — Encounter (HOSPITAL_COMMUNITY): Payer: Self-pay

## 2015-10-16 ENCOUNTER — Inpatient Hospital Stay (HOSPITAL_COMMUNITY)
Admission: AD | Admit: 2015-10-16 | Discharge: 2015-10-16 | Disposition: A | Discharge status: Home / Self Care | Payer: Self-pay | Source: Ambulatory Visit | Attending: Obstetrics & Gynecology | Admitting: Obstetrics & Gynecology

## 2015-10-16 ENCOUNTER — Inpatient Hospital Stay (HOSPITAL_COMMUNITY): Payer: Self-pay

## 2015-10-16 DIAGNOSIS — O4692 Antepartum hemorrhage, unspecified, second trimester: Secondary | ICD-10-CM

## 2015-10-16 DIAGNOSIS — N939 Abnormal uterine and vaginal bleeding, unspecified: Secondary | ICD-10-CM | POA: Insufficient documentation

## 2015-10-16 DIAGNOSIS — Z3A21 21 weeks gestation of pregnancy: Secondary | ICD-10-CM

## 2015-10-16 DIAGNOSIS — O26899 Other specified pregnancy related conditions, unspecified trimester: Secondary | ICD-10-CM | POA: Insufficient documentation

## 2015-10-16 LAB — URINALYSIS, ROUTINE W REFLEX MICROSCOPIC
Bilirubin Urine: NEGATIVE
Glucose, UA: NEGATIVE mg/dL
Ketones, ur: 15 mg/dL — AB
Nitrite: NEGATIVE
Protein, ur: NEGATIVE mg/dL
SPECIFIC GRAVITY, URINE: 1.025 (ref 1.005–1.030)
pH: 6 (ref 5.0–8.0)

## 2015-10-16 LAB — AMNISURE RUPTURE OF MEMBRANE (ROM) NOT AT ARMC: AMNISURE: NEGATIVE

## 2015-10-16 LAB — URINE MICROSCOPIC-ADD ON

## 2015-10-16 LAB — WET PREP, GENITAL
CLUE CELLS WET PREP: NONE SEEN
Sperm: NONE SEEN
Trich, Wet Prep: NONE SEEN
YEAST WET PREP: NONE SEEN

## 2015-10-16 NOTE — MAU Note (Signed)
Pt  Presents to MAU after being evaluated by the health dept today to rule out rupture of membranes. PT states that she has had some vaginal bleeding on and off for a month. Reports lower back and abdominal pain

## 2015-10-16 NOTE — MAU Provider Note (Signed)
History   G3P2002 at 3121 wka in with c/o vag bleeding for 1 month and leaking fluid for two days. Accompanied with abd cramping  CSN: [id number]  Arrival date & time 10/16/15  1721   None     Chief Complaint  Patient presents with  . Vaginal Bleeding    HPI  Past Medical History  Diagnosis Date  . Ovarian cyst   . Chronic kidney disease     stones and infection    Past Surgical History  Procedure Laterality Date  . No past surgeries      No family history on file.  Social History  Substance Use Topics  . Smoking status: Never Smoker   . Smokeless tobacco: Not on file  . Alcohol Use: Yes     Comment: not while pregnant    OB History    Gravida Para Term Preterm AB TAB SAB Ectopic Multiple Living   3 2 2  0 0 0 0 0 0 2      Review of Systems  Constitutional: Negative.   HENT: Negative.   Eyes: Negative.   Respiratory: Negative.   Cardiovascular: Negative.   Gastrointestinal: Positive for abdominal pain.  Endocrine: Negative.   Genitourinary: Positive for vaginal bleeding and vaginal discharge.  Musculoskeletal: Negative.   Skin: Negative.   Allergic/Immunologic: Negative.   Neurological: Negative.   Hematological: Negative.   Psychiatric/Behavioral: Negative.     Allergies  Review of patient's allergies indicates no known allergies.  Home Medications  No current outpatient prescriptions on file.  BP 122/62 mmHg  Pulse 86  Temp(Src) 98.2 F (36.8 C)  Resp 18  Ht 5\' 2"  (1.575 m)  Wt 186 lb (84.369 kg)  BMI 34.01 kg/m2  LMP 05/28/2015  Physical Exam  Constitutional: She is oriented to person, place, and time. She appears well-developed and well-nourished.  HENT:  Head: Normocephalic.  Eyes: Pupils are equal, round, and reactive to light.  Neck: Normal range of motion.  Cardiovascular: Normal rate, regular rhythm, normal heart sounds and intact distal pulses.   Pulmonary/Chest: Effort normal and breath sounds normal.  Abdominal: Soft. Bowel  sounds are normal.  Genitourinary: Vagina normal and uterus normal.  Musculoskeletal: Normal range of motion.  Neurological: She is alert and oriented to person, place, and time. She has normal reflexes.  Skin: Skin is warm and dry.  Psychiatric: She has a normal mood and affect. Her behavior is normal. Judgment and thought content normal.    MAU Course  Procedures (including critical care time)  Labs Reviewed  URINALYSIS, ROUTINE W REFLEX MICROSCOPIC (NOT AT [facility])   No results found.   No diagnosis found.    MDM  [place] normal, labs normal, will d/c home

## 2015-10-16 NOTE — Discharge Instructions (Signed)
Vaginal Bleeding During Pregnancy, Second Trimester ° A small amount of bleeding (spotting) from the vagina is common in pregnancy. Sometimes the bleeding is normal and is not a problem, and sometimes it is a sign of something serious. Be sure to tell your doctor about any bleeding from your vagina right away. °HOME CARE °· Watch your condition for any changes. °· Follow your doctor's instructions about how active you can be. °· If you are on bed rest: °¨ You may need to stay in bed and only get up to use the bathroom. °¨ You may be allowed to do some activities. °¨ If you need help, make plans for someone to help you. °· Write down: °¨ The number of pads you use each day. °¨ How often you change pads. °¨ How soaked (saturated) your pads are. °· Do not use tampons. °· Do not douche. °· Do not have sex or orgasms until your doctor says it is okay. °· If you pass any tissue from your vagina, save the tissue so you can show it to your doctor. °· Only take medicines as told by your doctor. °· Do not take aspirin because it can make you bleed. °· Do not exercise, lift heavy weights, or do any activities that take a lot of energy and effort unless your doctor says it is okay. °· Keep all follow-up visits as told by your doctor. °GET HELP IF:  °· You bleed from your vagina. °· You have cramps. °· You have labor pains. °· You have a fever that does not go away after you take medicine. °GET HELP RIGHT AWAY IF: °· You have very bad cramps in your back or belly (abdomen). °· You have contractions. °· You have chills. °· You pass large clots or tissue from your vagina. °· You bleed more. °· You feel light-headed or weak. °· You pass out (faint). °· You are leaking fluid or have a gush of fluid from your vagina. °MAKE SURE YOU: °· Understand these instructions. °· Will watch your condition. °· Will get help right away if you are not doing well or get worse. °  °This information is not intended to replace advice given to you by  your health care provider. Make sure you discuss any questions you have with your health care provider. °  °Document Released: 03/14/2014 Document Reviewed: 03/14/2014 °Elsevier Interactive Patient Education ©2016 Elsevier Inc. ° °

## 2015-10-17 LAB — GC/CHLAMYDIA PROBE AMP (~~LOC~~) NOT AT ARMC
Chlamydia: NEGATIVE
Neisseria Gonorrhea: NEGATIVE

## 2015-10-28 ENCOUNTER — Inpatient Hospital Stay (HOSPITAL_COMMUNITY)
Admission: AD | Admit: 2015-10-28 | Discharge: 2015-10-31 | DRG: 775 | Disposition: A | Discharge status: Home / Self Care | Payer: Medicaid Other | Source: Ambulatory Visit | Attending: Obstetrics & Gynecology | Admitting: Obstetrics & Gynecology

## 2015-10-28 ENCOUNTER — Inpatient Hospital Stay (HOSPITAL_COMMUNITY): Payer: Medicaid Other

## 2015-10-28 ENCOUNTER — Encounter (HOSPITAL_COMMUNITY): Payer: Self-pay | Admitting: *Deleted

## 2015-10-28 DIAGNOSIS — O42919 Preterm premature rupture of membranes, unspecified as to length of time between rupture and onset of labor, unspecified trimester: Secondary | ICD-10-CM | POA: Diagnosis present

## 2015-10-28 DIAGNOSIS — O26892 Other specified pregnancy related conditions, second trimester: Secondary | ICD-10-CM

## 2015-10-28 DIAGNOSIS — N898 Other specified noninflammatory disorders of vagina: Secondary | ICD-10-CM

## 2015-10-28 DIAGNOSIS — Z3A23 23 weeks gestation of pregnancy: Secondary | ICD-10-CM | POA: Diagnosis not present

## 2015-10-28 DIAGNOSIS — O42013 Preterm premature rupture of membranes, onset of labor within 24 hours of rupture, third trimester: Secondary | ICD-10-CM

## 2015-10-28 DIAGNOSIS — O42912 Preterm premature rupture of membranes, unspecified as to length of time between rupture and onset of labor, second trimester: Secondary | ICD-10-CM | POA: Diagnosis present

## 2015-10-28 DIAGNOSIS — N841 Polyp of cervix uteri: Secondary | ICD-10-CM

## 2015-10-28 DIAGNOSIS — O411290 Chorioamnionitis, unspecified trimester, not applicable or unspecified: Secondary | ICD-10-CM

## 2015-10-28 DIAGNOSIS — O4692 Antepartum hemorrhage, unspecified, second trimester: Secondary | ICD-10-CM

## 2015-10-28 DIAGNOSIS — O4102X1 Oligohydramnios, second trimester, fetus 1: Secondary | ICD-10-CM

## 2015-10-28 LAB — URINALYSIS, ROUTINE W REFLEX MICROSCOPIC
BILIRUBIN URINE: NEGATIVE
GLUCOSE, UA: NEGATIVE mg/dL
KETONES UR: NEGATIVE mg/dL
Nitrite: NEGATIVE
PROTEIN: NEGATIVE mg/dL
Specific Gravity, Urine: 1.01 (ref 1.005–1.030)
pH: 6 (ref 5.0–8.0)

## 2015-10-28 LAB — WET PREP, GENITAL
Clue Cells Wet Prep HPF POC: NONE SEEN
Sperm: NONE SEEN
Trich, Wet Prep: NONE SEEN
YEAST WET PREP: NONE SEEN

## 2015-10-28 LAB — CBC WITH DIFFERENTIAL/PLATELET
BASOS PCT: 0 %
Basophils Absolute: 0 10*3/uL (ref 0.0–0.1)
EOS ABS: 0.1 10*3/uL (ref 0.0–0.7)
EOS PCT: 0 %
HCT: 30 % — ABNORMAL LOW (ref 36.0–46.0)
HEMOGLOBIN: 10.2 g/dL — AB (ref 12.0–15.0)
Lymphocytes Relative: 11 %
Lymphs Abs: 1.8 10*3/uL (ref 0.7–4.0)
MCH: 29.8 pg (ref 26.0–34.0)
MCHC: 34 g/dL (ref 30.0–36.0)
MCV: 87.7 fL (ref 78.0–100.0)
MONOS PCT: 6 %
Monocytes Absolute: 0.9 10*3/uL (ref 0.1–1.0)
NEUTROS PCT: 83 %
Neutro Abs: 14.2 10*3/uL — ABNORMAL HIGH (ref 1.7–7.7)
PLATELETS: 262 10*3/uL (ref 150–400)
RBC: 3.42 MIL/uL — ABNORMAL LOW (ref 3.87–5.11)
RDW: 13.7 % (ref 11.5–15.5)
WBC: 17.1 10*3/uL — AB (ref 4.0–10.5)

## 2015-10-28 LAB — URINE MICROSCOPIC-ADD ON

## 2015-10-28 LAB — TYPE AND SCREEN
ABO/RH(D): A POS
Antibody Screen: NEGATIVE

## 2015-10-28 LAB — AMNISURE RUPTURE OF MEMBRANE (ROM) NOT AT ARMC: AMNISURE: NEGATIVE

## 2015-10-28 MED ORDER — OXYTOCIN 40 UNITS IN LACTATED RINGERS INFUSION - SIMPLE MED
62.5000 mL/h | INTRAVENOUS | Status: DC
  Administered 2015-10-29: 125 mL/h via INTRAVENOUS
  Administered 2015-10-29: 62.5 mL/h via INTRAVENOUS
  Filled 2015-10-28 (×2): qty 1000

## 2015-10-28 MED ORDER — ACETAMINOPHEN 325 MG PO TABS: 650.0000 mg | ORAL_TABLET | ORAL | Status: DC | PRN

## 2015-10-28 MED ORDER — ONDANSETRON HCL 4 MG/2ML IJ SOLN: 4.0000 mg | Freq: Four times a day (QID) | INTRAMUSCULAR | Status: DC | PRN

## 2015-10-28 MED ORDER — MAGNESIUM SULFATE 50 % IJ SOLN: 2.0000 g/h | INTRAVENOUS | Status: DC

## 2015-10-28 MED ORDER — BETAMETHASONE SOD PHOS & ACET 6 (3-3) MG/ML IJ SUSP
12.0000 mg | INTRAMUSCULAR | Status: DC
  Administered 2015-10-28: 12 mg via INTRAMUSCULAR
  Filled 2015-10-28: qty 2

## 2015-10-28 MED ORDER — OXYCODONE-ACETAMINOPHEN 5-325 MG PO TABS: 2.0000 | ORAL_TABLET | ORAL | Status: DC | PRN

## 2015-10-28 MED ORDER — FENTANYL CITRATE (PF) 100 MCG/2ML IJ SOLN
50.0000 ug | Freq: Once | INTRAMUSCULAR | Status: AC
  Administered 2015-10-28: 100 ug via INTRAVENOUS

## 2015-10-28 MED ORDER — CEFTRIAXONE SODIUM 1 G IJ SOLR
1.0000 g | INTRAMUSCULAR | Status: DC
  Administered 2015-10-28: 1 g via INTRAMUSCULAR
  Filled 2015-10-28: qty 10

## 2015-10-28 MED ORDER — ZOLPIDEM TARTRATE 5 MG PO TABS: 5.0000 mg | ORAL_TABLET | Freq: Every evening | ORAL | Status: DC | PRN

## 2015-10-28 MED ORDER — CALCIUM CARBONATE ANTACID 500 MG PO CHEW: 2.0000 | CHEWABLE_TABLET | ORAL | Status: DC | PRN

## 2015-10-28 MED ORDER — OXYTOCIN BOLUS FROM INFUSION
500.0000 mL | INTRAVENOUS | Status: DC
  Administered 2015-10-29 (×2): 500 mL via INTRAVENOUS
  Administered 2015-10-29: 300 mL via INTRAVENOUS

## 2015-10-28 MED ORDER — MAGNESIUM SULFATE 4 GM/100ML IV SOLN: 4.0000 g | Freq: Once | INTRAVENOUS | Status: DC

## 2015-10-28 MED ORDER — METRONIDAZOLE 500 MG PO TABS
500.0000 mg | ORAL_TABLET | Freq: Two times a day (BID) | ORAL | Status: DC
  Filled 2015-10-28 (×2): qty 1

## 2015-10-28 MED ORDER — LACTATED RINGERS IV BOLUS (SEPSIS)
1000.0000 mL | Freq: Once | INTRAVENOUS | Status: AC
Start: 2015-10-28 — End: 2015-10-29
  Administered 2015-10-28: 1000 mL via INTRAVENOUS

## 2015-10-28 MED ORDER — LACTATED RINGERS IV SOLN: 500.0000 mL | INTRAVENOUS | Status: DC | PRN

## 2015-10-28 MED ORDER — LIDOCAINE HCL (PF) 1 % IJ SOLN
30.0000 mL | INTRAMUSCULAR | Status: DC | PRN
  Filled 2015-10-28: qty 30

## 2015-10-28 MED ORDER — PRENATAL MULTIVITAMIN CH: 1.0000 | ORAL_TABLET | Freq: Every day | ORAL | Status: DC

## 2015-10-28 MED ORDER — AZITHROMYCIN 500 MG PO TABS: 500.0000 mg | ORAL_TABLET | Freq: Every day | ORAL | Status: DC

## 2015-10-28 MED ORDER — AZITHROMYCIN 250 MG PO TABS
1000.0000 mg | ORAL_TABLET | Freq: Once | ORAL | Status: AC
  Administered 2015-10-28: 1000 mg via ORAL
  Filled 2015-10-28: qty 4

## 2015-10-28 MED ORDER — OXYCODONE-ACETAMINOPHEN 5-325 MG PO TABS: 1.0000 | ORAL_TABLET | ORAL | Status: DC | PRN

## 2015-10-28 MED ORDER — MAGNESIUM SULFATE 50 % IJ SOLN
2.0000 g/h | INTRAMUSCULAR | Status: DC
Start: 2015-10-28 — End: 2015-10-28
  Filled 2015-10-28: qty 80

## 2015-10-28 MED ORDER — CITRIC ACID-SODIUM CITRATE 334-500 MG/5ML PO SOLN: 30.0000 mL | ORAL | Status: DC | PRN

## 2015-10-28 MED ORDER — DOCUSATE SODIUM 100 MG PO CAPS: 100.0000 mg | ORAL_CAPSULE | Freq: Every day | ORAL | Status: DC

## 2015-10-28 MED ORDER — LACTATED RINGERS IV SOLN: INTRAVENOUS | Status: DC

## 2015-10-28 MED ORDER — MAGNESIUM SULFATE BOLUS VIA INFUSION
6.0000 g | Freq: Once | INTRAVENOUS | Status: DC
  Filled 2015-10-28: qty 500

## 2015-10-28 MED ORDER — DEXTROSE 5 % IV SOLN
500.0000 mg | INTRAVENOUS | Status: DC
  Administered 2015-10-28: 500 mg via INTRAVENOUS
  Filled 2015-10-28: qty 500

## 2015-10-28 MED ORDER — FENTANYL CITRATE (PF) 100 MCG/2ML IJ SOLN
INTRAMUSCULAR | Status: AC
  Administered 2015-10-28: 100 ug via INTRAVENOUS
  Filled 2015-10-28: qty 2

## 2015-10-28 MED ORDER — AMOXICILLIN 500 MG PO CAPS: 500.0000 mg | ORAL_CAPSULE | Freq: Three times a day (TID) | ORAL | Status: DC

## 2015-10-28 MED ORDER — SODIUM CHLORIDE 0.9 % IV SOLN
2.0000 g | Freq: Four times a day (QID) | INTRAVENOUS | Status: DC
  Administered 2015-10-28: 2 g via INTRAVENOUS
  Filled 2015-10-28 (×4): qty 2000

## 2015-10-28 NOTE — H&P (Signed)
FACULTY PRACTICE ANTEPARTUM ADMISSION HISTORY AND PHYSICAL NOTE   History of Present Illness: Teresa LennoxCorina Beck is a 29 y.o. L2G4010G3P1102 at 7241w5d admitted for rupture of membranes. Patient was seen in MAU on 12/5 for leaking fluid, vaginal bleeding and cramping. She has a thorough examination with fern test, amnisure, and OB US which showed a normal AFI and no abruption. Since that time she reports continued leaking of fluid. She also reports vaginal bleeding, scant and does not soak pads. She reports lower abdominal pain/cramping that is every 30 minutes.  She denies constant abdominal pain. Denies fever/chills.   Patient reports the fetal movement as decreased . Patient reports uterine contraction  activity as none. Patient reports  vaginal bleeding as scant staining. Patient describes fluid per vagina as Clear/thick  Fetal presentation is unsure.  Patient Active Problem List   Diagnosis Date Noted  . Preterm premature rupture of membranes (PPROM) with unknown onset of labor 10/28/2015    Past Medical History  Diagnosis Date  . Ovarian cyst   . Chronic kidney disease     stones and infection    Past Surgical History  Procedure Laterality Date  . No past surgeries      OB History  Gravida Para Term Preterm AB SAB TAB Ectopic Multiple Living  3 2 1 1  0 0 0 0 0 2    # Outcome Date GA Lbr Len/2nd Weight Sex Delivery Anes PTL Lv  3 Current           2 Term 05/23/07 6258w0d   Genella MechM Vag-Spont   Y  1 Preterm 09/25/05 4619w0d   M Vag-Spont   Y      Social History   Social History  . Marital Status: Married    Spouse Name: N/A  . Number of Children: N/A  . Years of Education: N/A   Social History Main Topics  . Smoking status: Never Smoker   . Smokeless tobacco: None  . Alcohol Use: Yes     Comment: not while pregnant  . Drug Use: No  . Sexual Activity: Yes   Other Topics Concern  . None   Social History Narrative    History reviewed. No pertinent family history.  No Known  Allergies  Prescriptions prior to admission  Medication Sig Dispense Refill Last Dose  . Prenatal Vit-Fe Fumarate-FA (PRENATAL MULTIVITAMIN) TABS tablet Take 1 tablet by mouth daily at 12 noon.   10/28/2015 at Unknown time  . cephALEXin (KEFLEX) 500 MG capsule Take 1 capsule (500 mg total) by mouth 4 (four) times daily. (Patient not taking: Reported on 10/16/2015) 28 capsule 0 Not Taking at Unknown time  . cyclobenzaprine (FLEXERIL) 10 MG tablet Take 1 tablet (10 mg total) by mouth 3 (three) times daily as needed for muscle spasms. (Patient not taking: Reported on 10/16/2015) 30 tablet 0 Not Taking at Unknown time    Review of Systems - Negative except vaginal bleeding, lower abdominal cramping, leaking fluid  Vitals:  BP 112/53 mmHg  Pulse 77  Temp(Src) 98.1 F (36.7 C)  Resp 18  LMP 05/28/2015 Physical Examination: CONSTITUTIONAL: Well-developed, well-nourished female in no acute distress.  HENT:  Normocephalic, atraumatic, External right and left ear normal. Oropharynx is clear and moist EYES: Conjunctivae and EOM are normal. Pupils are equal, round, and reactive to light. No scleral icterus.  NECK: Normal range of motion, supple, no masses SKIN: Skin is warm and dry. No rash noted. Not diaphoretic. No erythema. No pallor. NEUROLGIC: Alert and oriented  to person, place, and time. Normal reflexes, muscle tone coordination. No cranial nerve deficit noted. PSYCHIATRIC: Normal mood and affect. Normal behavior. Normal judgment and thought content. CARDIOVASCULAR: Normal heart rate noted, regular rhythm RESPIRATORY: Effort and breath sounds normal, no problems with respiration noted ABDOMEN: Soft, nontender, nondistended, gravid. MUSCULOSKELETAL: Normal range of motion. No edema and no tenderness. 2+ distal pulses.  Cervix: Evaluated by sterile speculum exam- which showed pooling for thick fluid in the posterior vaginal vault, pus like consistency. Used two fox swabs to remove fluid to see  cervix.  Cervix was also evaluated by digital exam and found to be 0.5 cm/ Long/Floating and fetal presentation is unsure. Membranes: Unsure Fetal Monitoring:Baseline: 160 bpm Tocometer: Flat  Labs:  Results for orders placed or performed during the hospital encounter of 10/28/15 (from the past 24 hour(s))  Urinalysis, Routine w reflex microscopic (not at [facility])   Collection Time: 10/28/15 12:57 PM  Result Value Ref Range   Color, Urine YELLOW YELLOW   APPearance HAZY (A) CLEAR   Specific Gravity, Urine 1.010 1.005 - 1.030   pH 6.0 5.0 - 8.0   Glucose, UA NEGATIVE NEGATIVE mg/dL   Hgb urine dipstick LARGE (A) NEGATIVE   Bilirubin Urine NEGATIVE NEGATIVE   Ketones, ur NEGATIVE NEGATIVE mg/dL   Protein, ur NEGATIVE NEGATIVE mg/dL   Nitrite NEGATIVE NEGATIVE   Leukocytes, UA LARGE (A) NEGATIVE  Urine microscopic-add on   Collection Time: 10/28/15 12:57 PM  Result Value Ref Range   Squamous Epithelial / LPF 0-5 (A) NONE SEEN   WBC, UA TOO NUMEROUS TO COUNT 0 - 5 WBC/hpf   RBC / HPF TOO NUMEROUS TO COUNT 0 - 5 RBC/hpf   Bacteria, UA FEW (A) NONE SEEN  Wet prep, genital   Collection Time: 10/28/15  2:30 PM  Result Value Ref Range   Yeast Wet Prep HPF POC NONE SEEN NONE SEEN   Trich, Wet Prep NONE SEEN NONE SEEN   Clue Cells Wet Prep HPF POC NONE SEEN NONE SEEN   WBC, Wet Prep HPF POC MODERATE (A) NONE SEEN   Sperm NONE SEEN   Amnisure rupture of membrane (rom)not at [facility]   Collection Time: 10/28/15  6:25 PM  Result Value Ref Range   Amnisure ROM NEGATIVE     Imaging Studies: [place] Mfm Ob Transvaginal  2015-10-18  OBSTETRICAL ULTRASOUND: This exam was performed within a [facility]. The OB US report was generated in the AS system, and faxed to the ordering physician.  This report is available in the [employer]. See the AS Obstetric US report via the Image Link.  [place] Mfm Ob Limited  2015-10-18  OBSTETRICAL ULTRASOUND: This exam was performed within a [facility] Department. The OB US report was generated in the AS system, and faxed to the ordering physician.  This report is available in the [employer]. See the AS Obstetric US report via the Image Link.    Assessment and Plan: Patient Active Problem List   Diagnosis Date Noted  . Preterm premature rupture of membranes (PPROM) with unknown onset of labor 10/28/2015   #PPROM- by AFI Admit to Antenatal Routine antenatal care Latency abx - Ampicillin and Azithromycin MFM consulted Repeat US in 1 week  #FWB: BMZ ordered. NICU consulted. Would warrant magnesium if laboring and > 24wks   Federico Flake, MD Fellow Obstetrician & Gynecologist Faculty Practice, [facility]

## 2015-10-28 NOTE — MAU Provider Note (Signed)
History     CSN: [id number] Arrival date and time: 10/28/15 1246 First Provider Initiated Contact with Patient 10/28/15 1417      Chief Complaint  Patient presents with  . Abdominal Pain  . Vaginal Bleeding   HPI Patient is 29 y.o. [id number] 6716w5d here with complaints of vaginal bleeding, abdominal pain. Seen in the MAU on 12/5 for similar. She has wet prep and GC at that time which were negative. She reports she has continued to have vaginal bleeding that is described as spotting and copious thin/watery discharge. She reports it has stayed the same since she was seen. She reports pain in her lower abdomen as well that she describes as contractions.  [facility] and reports she was evaluated by a provider there as well for this same complaint and they said it was normal.   Pregnancy complicated by H/o PTB @32  weeks  +FM, denies LOF, contractions, vaginal discharge.   OB History    Gravida Para Term Preterm AB TAB SAB Ectopic Multiple Living   3 2 1 1  0 0 0 0 0 4      Past Medical History  Diagnosis Date  . Ovarian cyst   . Chronic kidney disease     stones and infection    Past Surgical History  Procedure Laterality Date  . No past surgeries      History reviewed. No pertinent family history.  Social History  Substance Use Topics  . Smoking status: Never Smoker   . Smokeless tobacco: None  . Alcohol Use: Yes     Comment: not while pregnant    Allergies: No Known Allergies  Prescriptions prior to admission  Medication Sig Dispense Refill Last Dose  . Prenatal Vit-Fe Fumarate-FA (PRENATAL MULTIVITAMIN) TABS tablet Take 1 tablet by mouth daily at 12 noon.   10/28/2015 at Unknown time  . cephALEXin (KEFLEX) 500 MG capsule Take 1 capsule (500 mg total) by mouth 4 (four) times daily. (Patient not taking: Reported on 10/16/2015) 28 capsule 0 Not Taking at Unknown time  . cyclobenzaprine (FLEXERIL) 10 MG tablet Take 1 tablet (10 mg total) by mouth 3 (three)  times daily as needed for muscle spasms. (Patient not taking: Reported on 10/16/2015) 30 tablet 0 Not Taking at Unknown time    Review of Systems  Constitutional: Negative for fever and chills.  Eyes: Negative for blurred vision and double vision.  Respiratory: Negative for cough and shortness of breath.   Cardiovascular: Negative for chest pain and orthopnea.  Gastrointestinal: Positive for abdominal pain. Negative for nausea and vomiting.  Genitourinary: Negative for dysuria, frequency and flank pain.  Musculoskeletal: Negative for myalgias.  Skin: Negative for rash.  Neurological: Negative for dizziness, tingling, weakness and headaches.  Endo/Heme/Allergies: Does not bruise/bleed easily.  Psychiatric/Behavioral: Negative for depression and suicidal ideas. The patient is not nervous/anxious.    Physical Exam   Blood pressure 103/61, pulse 95, temperature 97.9 F (36.6 C), resp. rate 18, last menstrual period 05/28/2015. FHR 150  Physical Exam  Nursing note and vitals reviewed. Constitutional: She is oriented to person, place, and time. She appears well-developed and well-nourished. No distress.  Pregnant female  HENT:  Head: Normocephalic and atraumatic.  Eyes: Conjunctivae are normal. No scleral icterus.  Neck: Normal range of motion. Neck supple.  Cardiovascular: Normal rate and intact distal pulses.   Respiratory: Effort normal. She exhibits no tenderness.  GI: Soft. There is no tenderness. There is no rebound and no guarding.  Gravid  Genitourinary: Vagina normal. Cervix exhibits discharge (thin, copious and +pooling). Cervix exhibits no motion tenderness and no friability.  Visually closed. Redundant vaginal tissue. +cervical polyp at external os, friable.   FT/thick cervix.   Musculoskeletal: Normal range of motion. She exhibits no edema.  Neurological: She is alert and oriented to person, place, and time.  Skin: Skin is warm and dry. No rash noted.  Psychiatric: She  has a normal mood and affect.    MAU Course  Procedures  MDM Fern Test: neg NST- toco is quiet.   Reviewed OB US -obtained 12/5 Anterior placenta above os, no abruption or [facility] noted Normal cervical length Normal AFI  4:31 PM Discussed with Dr. Debroah Loop. He recommended empiric treatment for cervicitis despite prior negative testing and AFI to help with question of ROM.   6:51 PM reviewed [place] results, no official report in chart. AFI ~3. Amnisure ordered and has resulted as negative.    Assessment and Plan  Teresa Beck is a 29 y.o. G3P1104 at [redacted]w[redacted]d presenting with vaginal discharge, bleeding and abdominal pain  Admitted to antenatal for [facility]   Federico Flake 10/28/2015, 2:17 PM

## 2015-10-28 NOTE — MAU Note (Signed)
Pt presents to MAU with complaints of vaginal bleeding, with contractions that started a month ago. Was evaluated a week ago for same complaints

## 2015-10-28 NOTE — Progress Notes (Signed)
FACULTY PRACTICE ANTEPARTUM(COMPREHENSIVE) NOTE  Teresa LennoxCorina Beck is a 29 y.o. G3P1102 at 7379w1d by early ultrasound who is admitted for rupture of membranes, PROM.   Fetal presentation is cephalic. Length of Stay:  0  Days  Subjective: Lower abdominal pains Patient reports the fetal movement as active. Patient reports uterine contraction  activity as irregular. Patient reports  vaginal bleeding as scant staining. Patient describes fluid per vagina as Other yellow.  Vitals:  Blood pressure 113/52, pulse 87, temperature 98.2 F (36.8 C), temperature source Oral, resp. rate 18, height 5\' 2"  (1.575 m), weight 86.183 kg (190 lb), last menstrual period 05/28/2015. Physical Examination:  General appearance - in mild to moderate distress Heart - normal rate and regular rhythm Abdomen - soft, nontender, nondistended Fundal Height:  size equals dates Cervical Exam: Not evaluated Extremities: extremities normal, atraumatic, no cyanosis or edema and Homans sign is negative Membranes:ruptured  Fetal Monitoring:   Fetal Heart Rate A      Mode  External filed at 10/28/2015 2057    Baseline Rate (A)  160 bpm filed at 10/28/2015 2057        Labs:  Results for orders placed or performed during the hospital encounter of 10/28/15 (from the past 24 hour(s))  Urinalysis, Routine w reflex microscopic (not at [facility])   Collection Time: 10/28/15 12:57 PM  Result Value Ref Range   Color, Urine YELLOW YELLOW   APPearance HAZY (A) CLEAR   Specific Gravity, Urine 1.010 1.005 - 1.030   pH 6.0 5.0 - 8.0   Glucose, UA NEGATIVE NEGATIVE mg/dL   Hgb urine dipstick LARGE (A) NEGATIVE   Bilirubin Urine NEGATIVE NEGATIVE   Ketones, ur NEGATIVE NEGATIVE mg/dL   Protein, ur NEGATIVE NEGATIVE mg/dL   Nitrite NEGATIVE NEGATIVE   Leukocytes, UA LARGE (A) NEGATIVE  Urine microscopic-add on   Collection Time: 10/28/15 12:57 PM  Result Value Ref Range   Squamous Epithelial / LPF 0-5 (A) NONE SEEN   WBC, UA  TOO NUMEROUS TO COUNT 0 - 5 WBC/hpf   RBC / HPF TOO NUMEROUS TO COUNT 0 - 5 RBC/hpf   Bacteria, UA FEW (A) NONE SEEN  Wet prep, genital   Collection Time: 10/28/15  2:30 PM  Result Value Ref Range   Yeast Wet Prep HPF POC NONE SEEN NONE SEEN   Trich, Wet Prep NONE SEEN NONE SEEN   Clue Cells Wet Prep HPF POC NONE SEEN NONE SEEN   WBC, Wet Prep HPF POC MODERATE (A) NONE SEEN   Sperm NONE SEEN   Amnisure rupture of membrane (rom)not at [facility]   Collection Time: 10/28/15  6:25 PM  Result Value Ref Range   Amnisure ROM NEGATIVE   CBC with Differential/Platelet   Collection Time: 10/28/15  8:20 PM  Result Value Ref Range   WBC 17.1 (H) 4.0 - 10.5 K/uL   RBC 3.42 (L) 3.87 - 5.11 MIL/uL   Hemoglobin 10.2 (L) 12.0 - 15.0 g/dL   HCT 45.430.0 (L) 09.836.0 - 11.946.0 %   MCV 87.7 78.0 - 100.0 fL   MCH 29.8 26.0 - 34.0 pg   MCHC 34.0 30.0 - 36.0 g/dL   RDW 14.713.7 82.911.5 - 56.215.5 %   Platelets 262 150 - 400 K/uL   Neutrophils Relative % 83 %   Neutro Abs 14.2 (H) 1.7 - 7.7 K/uL   Lymphocytes Relative 11 %   Lymphs Abs 1.8 0.7 - 4.0 K/uL   Monocytes Relative 6 %   Monocytes Absolute 0.9 0.1 -  1.0 K/uL   Eosinophils Relative 0 %   Eosinophils Absolute 0.1 0.0 - 0.7 K/uL   Basophils Relative 0 %   Basophils Absolute 0.0 0.0 - 0.1 K/uL  Type and screen [facility]   Collection Time: 10/28/15  8:20 PM  Result Value Ref Range   ABO/RH(D) A POS    Antibody Screen NEG    Sample Expiration 10/31/2015     Imaging Studies:     Currently EPIC will not allow sonographic studies to automatically populate into notes.  In the meantime, copy and paste results into note or free text.  Medications:  Scheduled . ampicillin (OMNIPEN) IV  2 g Intravenous Q6H   Followed by  . [START ON 10/30/2015] amoxicillin  500 mg Oral Q8H  . azithromycin  500 mg Intravenous Q24H   Followed by  . [START ON 10/30/2015] azithromycin  500 mg Oral Q2200  . betamethasone acetate-betamethasone sodium phosphate  12 mg  Intramuscular Q24H  . [START ON 10/29/2015] docusate sodium  100 mg Oral Daily  . fentaNYL      . fentaNYL (SUBLIMAZE) injection  50 mcg Intravenous Once  . lactated ringers  1,000 mL Intravenous Once  . [START ON 10/29/2015] prenatal multivitamin  1 tablet Oral Q1200   I have reviewed the patient's current medications.  ASSESSMENT: Patient Active Problem List   Diagnosis Date Noted  . Preterm premature rupture of membranes (PPROM) with unknown onset of labor 10/28/2015  threatened preterm labor, cervix 4 c long on [place], cephalic Discussed with NICU and because unit is full they request we transfer patient PLAN: Spoke to Dr. Conley Simmonds at [facility] who accepts transfer to L&D  Teresa Beck 10/28/2015,11:19 PM

## 2015-10-28 NOTE — MAU Note (Signed)
Report called to Ruthe RN in Antenatal. Pt may come to 152

## 2015-10-29 ENCOUNTER — Encounter (HOSPITAL_COMMUNITY): Payer: Self-pay

## 2015-10-29 DIAGNOSIS — Z3A23 23 weeks gestation of pregnancy: Secondary | ICD-10-CM

## 2015-10-29 DIAGNOSIS — O42912 Preterm premature rupture of membranes, unspecified as to length of time between rupture and onset of labor, second trimester: Secondary | ICD-10-CM

## 2015-10-29 LAB — URINE CULTURE: SPECIAL REQUESTS: NORMAL

## 2015-10-29 MED ORDER — LANOLIN HYDROUS EX OINT: TOPICAL_OINTMENT | CUTANEOUS | Status: DC | PRN

## 2015-10-29 MED ORDER — SIMETHICONE 80 MG PO CHEW: 80.0000 mg | CHEWABLE_TABLET | ORAL | Status: DC | PRN

## 2015-10-29 MED ORDER — DIBUCAINE 1 % RE OINT: 1.0000 "application " | TOPICAL_OINTMENT | RECTAL | Status: DC | PRN

## 2015-10-29 MED ORDER — MISOPROSTOL 200 MCG PO TABS
ORAL_TABLET | ORAL | Status: AC
  Administered 2015-10-29: 200 ug
  Filled 2015-10-29: qty 4

## 2015-10-29 MED ORDER — OXYTOCIN 40 UNITS IN LACTATED RINGERS INFUSION - SIMPLE MED: 62.5000 mL/h | INTRAVENOUS | Status: DC | PRN

## 2015-10-29 MED ORDER — BENZOCAINE-MENTHOL 20-0.5 % EX AERO
1.0000 "application " | INHALATION_SPRAY | CUTANEOUS | Status: DC | PRN
  Administered 2015-10-29: 1 via TOPICAL
  Filled 2015-10-29: qty 56

## 2015-10-29 MED ORDER — DOCUSATE SODIUM 100 MG PO CAPS
100.0000 mg | ORAL_CAPSULE | Freq: Two times a day (BID) | ORAL | Status: DC
  Administered 2015-10-29 – 2015-10-31 (×4): 100 mg via ORAL
  Filled 2015-10-29 (×4): qty 1

## 2015-10-29 MED ORDER — ONDANSETRON HCL 4 MG PO TABS: 4.0000 mg | ORAL_TABLET | ORAL | Status: DC | PRN

## 2015-10-29 MED ORDER — MISOPROSTOL 200 MCG PO TABS
ORAL_TABLET | ORAL | Status: AC
  Administered 2015-10-29: 600 ug via ORAL
  Filled 2015-10-29: qty 1

## 2015-10-29 MED ORDER — IBUPROFEN 600 MG PO TABS
600.0000 mg | ORAL_TABLET | Freq: Four times a day (QID) | ORAL | Status: DC
  Administered 2015-10-29 – 2015-10-30 (×8): 600 mg via ORAL
  Filled 2015-10-29 (×8): qty 1

## 2015-10-29 MED ORDER — PRENATAL MULTIVITAMIN CH
1.0000 | ORAL_TABLET | Freq: Every day | ORAL | Status: DC
  Administered 2015-10-30: 1 via ORAL
  Filled 2015-10-29: qty 1

## 2015-10-29 MED ORDER — WITCH HAZEL-GLYCERIN EX PADS: 1.0000 "application " | MEDICATED_PAD | CUTANEOUS | Status: DC | PRN

## 2015-10-29 MED ORDER — MISOPROSTOL 200 MCG PO TABS
800.0000 ug | ORAL_TABLET | Freq: Once | ORAL | Status: AC
  Administered 2015-10-29: 600 ug via ORAL

## 2015-10-29 MED ORDER — ACETAMINOPHEN 325 MG PO TABS: 650.0000 mg | ORAL_TABLET | ORAL | Status: DC | PRN

## 2015-10-29 MED ORDER — DIPHENHYDRAMINE HCL 25 MG PO CAPS: 25.0000 mg | ORAL_CAPSULE | Freq: Four times a day (QID) | ORAL | Status: DC | PRN

## 2015-10-29 MED ORDER — ONDANSETRON HCL 4 MG/2ML IJ SOLN: 4.0000 mg | INTRAMUSCULAR | Status: DC | PRN

## 2015-10-29 MED ORDER — TERBUTALINE SULFATE 1 MG/ML IJ SOLN
0.2500 mg | Freq: Once | INTRAMUSCULAR | Status: DC
  Filled 2015-10-29: qty 1

## 2015-10-29 MED ORDER — OXYCODONE-ACETAMINOPHEN 5-325 MG PO TABS
1.0000 | ORAL_TABLET | ORAL | Status: DC | PRN
  Administered 2015-10-29 (×2): 1 via ORAL
  Filled 2015-10-29 (×2): qty 1

## 2015-10-29 NOTE — Consult Note (Signed)
[facility]   Delivery Note:  SVD       10/29/2015  12:42 AM  I was called to the delivery room at the request of the patient's obstetrician (Dr. Arnold) for precipitous preterm delivery at 23 and 2/[redacted] weeks gestation.  PRENATAL HX:  This is a 29 y/o G3P1102 at 23 and 2/[redacted] weeks gestation admitted yesterday for PPROM.  She was given a dose of BMZ ~ 4 hours prior to delivery and started on antibiotics.    DELIVERY:  Infant was delivered precipitously in the mother's bathroom, NICU team arrived at around 4 minutes.  HR was ~60 and quickly rose to > 100 with PPV.  Infant was intubated with 2.5 ETT at around 10 minutes of age, secured to 6.5 cm.  Surfactant administered at 13 minutes of age.  Maxiumum FiO2 was 100%, but weaned down to 25% by 15 minutes of age.  Mother was updated at the bedside and infant taken to NICU.  APGARs were 8 and 8 at 5 and 10 minutes.    _____________________ Electronically Signed By: Kordae Buonocore, MD Neonatologist  

## 2015-10-29 NOTE — Progress Notes (Signed)
Checked on patients needs.  Ordered patient a late snack and breakfast.  Spanish Interpreter

## 2015-10-29 NOTE — Progress Notes (Signed)
Checked on patients needs. Patient refused dinner.  Patient stated that she wasn't hungry.  Spanish interpreter

## 2015-10-29 NOTE — Progress Notes (Signed)
Went to check on patient with rrob [facility].The patient was about to receive terbutaline and got up to go to the bathroom and precipitously and unexpectedly delivered the infant while in the bathroom. Code AGPAR was called and NICU arrived. Dr. Alvester MorinNewton and dr. Debroah LoopArnold arrived at 0013 Infant was resuscitated in the room and brought up to NICU.

## 2015-10-29 NOTE — Lactation Note (Signed)
This note was copied from the chart of Teresa Beck. Lactation Consultation Note  Double electric breast pump has been set-up at the bedside.  Mom has been pumping every six hours.  Hand expression taught with no colostrum expressed.  Encouraged mom to continue pumping and hand expression and to bring any amount expressed to the NICU.  She agreed that she would.  Follow-up planned.  Patient Name: Teresa Beck ZHGDJ'MToday's Date: 10/29/2015 Reason for consult: Initial assessment;NICU baby;Infant < 6lbs   Maternal Data Has patient been taught Hand Expression?: Yes (colostrum not expressed) Does the patient have breastfeeding experience prior to this delivery?: Yes  Feeding    LATCH Score/Interventions                      Lactation Tools Discussed/Used Initiated by:: RN  Date initiated:: 10/29/15   Consult Status Consult Status: Follow-up Date: 10/30/15 Follow-up type: In-patient    Soyla DryerJoseph, Efraim Vanallen 10/29/2015, 1:56 PM

## 2015-10-30 LAB — GC/CHLAMYDIA PROBE AMP (~~LOC~~) NOT AT ARMC
Chlamydia: NEGATIVE
NEISSERIA GONORRHEA: NEGATIVE

## 2015-10-30 NOTE — Progress Notes (Signed)
Initial visit with patient and her spouse to introduce spiritual care services.  Pt was very candid in her expression of grief and anger at the scary and sudden delivery of her daughter Mirna MiresSanibel.  She reports that she is upset not to have the baby with her and that every time someone walks into the room she is afraid they are going to tell her that something has happened to [place].  She shared that she has two sons 29 year old [place]orge and 29 year old [place] and that Donald PoreJorge was born at 6832 weeks, but he is fine and her second child was at term and he is fine, but she is afraid that Mirna MiresSanibel will not be fine.  She says tomorrow she has to go home without her baby and that her milk is starting to come in and she has no baby.  She shared that people will see her milk and wonder where her baby is and she'll have to say in the NICU.  She expressed anger at God that other people don't go through such difficult things and that some women abort their children or don't want them and yet she so desperately wants to bring her daughter home and is afraid she won't be able to.  Chaplain normalized and validated her expression of grief and sadness and loss of control.  Family has no local support and chaplain shared that we are here to support them and sanibel however we can.  Encouraged her to contact chaplain if she wants someone to sit with her while she visits the baby or she wants to talk and shared that we are also there to show love to [facility] by extending the love her parents have for her so not to be alarmed if they see us visiting her.  Mother is grateful for the visit and support and requested prayer to the virgin mary and Jesus to help her baby.  Chaplain prayed and recognized that during this season we remember another baby who was born in an unexpected way and reminded family that the same God who was very present in that space is also present with them now.  Family requested information about chapel, which I provided  and also gave them directions on how to contact spiritual care services if they desire a visit and don't see us on the unit.    Please page as further needs arise.  Maryanna ShapeAmanda M. Carley Hammedavee Lomax, M.Div. [facility] Chaplain Pager [phone] Office [phone]     10/30/15 1444  Clinical Encounter Type  Visited With Patient and family together  Visit Type Initial;Spiritual support  Referral From Chaplain  Spiritual Encounters  Spiritual Needs Prayer;Emotional  Stress Factors  Patient Stress Factors Loss of control

## 2015-10-30 NOTE — Lactation Note (Signed)
This note was copied from the chart of Teresa Beck. Lactation Consultation Note Follow up assessment.  Mom is pumping and hand expressing every 3 hours.  She demonstrated hand expression and obtained a drop from each breast.  Instructed on collecting and transferring to the NICU for buccal swabs.  [facility].  Follow up in AM.  Patient Name: Teresa Beck Today'Beck Date: 10/30/2015     Maternal Data    Feeding    LATCH Score/Interventions                      Lactation Tools Discussed/Used     Consult Status      [name], Teresa Beck 10/30/2015, 1:19 PM

## 2015-10-30 NOTE — Progress Notes (Signed)
RN states patient speaks [place]. Eda H Royal Interpreter.
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
## 2015-10-30 NOTE — Progress Notes (Signed)
Post Partum Day 1 Subjective:  Teresa Beck is a 29 y.o. [id number] 6110w2d s/p preciptious SVD following PPROM.  No acute events overnight.  Pt denies problems with ambulating, voiding or po intake.  She denies nausea or vomiting.  Pain is well controlled.  She has had flatus.  Lochia Small.  Plan for birth control is unsure.  Method of Feeding: pumped breast milk.   Objective: Blood pressure 98/50, pulse 57, temperature 97.8 F (36.6 C), temperature source Oral, resp. rate 18, height 5\' 2"  (1.575 m), weight 86.183 kg (190 lb), last menstrual period 05/28/2015, SpO2 98 %, unknown if currently breastfeeding.  Physical Exam:  General: alert, cooperative and no distress Lochia:normal flow Chest: normal WOB Heart: Regular rate Abdomen: +BS, soft, mild TTP (appropriate) Uterine Fundus: firm, below umbillicus DVT Evaluation: No evidence of DVT seen on physical exam. Extremities: No edema   Recent Labs  10/28/15 2020  HGB 10.2*  HCT 30.0*    Assessment/Plan:  ASSESSMENT: Teresa Beck is a 29 y.o. [id number] 8610w2d s/p precipitous SVD after PPROM.   Plan for discharge tomorrow Continue routine PP care Breastfeeding support PRN  LOS: 2 days   Olivia Cantermanda Cassanda Walmer 10/30/2015, 6:57 AM

## 2015-10-31 DIAGNOSIS — O411290 Chorioamnionitis, unspecified trimester, not applicable or unspecified: Secondary | ICD-10-CM

## 2015-10-31 MED ORDER — IBUPROFEN 600 MG PO TABS: 600.0000 mg | ORAL_TABLET | Freq: Four times a day (QID) | ORAL | Status: DC

## 2015-10-31 NOTE — Progress Notes (Signed)
CSW attempted to meet with parents to offer support and condolences on the loss of their daughter, but RN states Chaplain is in with parents at this time.  CSW will attempt again at a later time if possible.

## 2015-10-31 NOTE — Progress Notes (Signed)
Teaching complete pt and spouse  Will call back  When they have decided on a funeral home

## 2015-10-31 NOTE — Progress Notes (Signed)
I spent time offering bereavement support to Ms Teresa Beck and her family, including her two older sons, age 978 and 6710.  They are able to talk about grieving the loss of their baby and the loss of their future plans with her.  I offered grief education as well as pastoral presence.  They are not able to think yet about making arrangements for their baby.  I will provide them with the phone number to call House Cover[facility] when they are able to make a decision.  They have the information about funeral homes, they just need more time to emotionally go through the process of making the plans.  Chaplain Dyanne CarrelKaty Ashely Goosby, Bcc Pager, [phone] 11:32 AM    10/31/15 1100  Clinical Encounter Type  Visited With Patient and family together  Visit Type Spiritual support  Referral From Chaplain;Nurse  Spiritual Encounters  Spiritual Needs Grief support;Emotional  Stress Factors  Patient Stress Factors Loss (Perinatal loss)

## 2015-10-31 NOTE — Discharge Instructions (Signed)
Postpartum Care After Vaginal Delivery After you deliver your newborn (postpartum period), the usual stay in the hospital is 24-72 hours. If there were problems with your labor or delivery, or if you have other medical problems, you might be in the hospital longer.  While you are in the hospital, you will receive help and instructions on how to care for yourself and your newborn during the postpartum period.  While you are in the hospital:  Be sure to tell your nurses if you have pain or discomfort, as well as where you feel the pain and what makes the pain worse.  If you had an incision made near your vagina (episiotomy) or if you had some tearing during delivery, the nurses may put ice packs on your episiotomy or tear. The ice packs may help to reduce the pain and swelling.  If you are breastfeeding, you may feel uncomfortable contractions of your uterus for a couple of weeks. This is normal. The contractions help your uterus get back to normal size.  It is normal to have some bleeding after delivery.  For the first 1-3 days after delivery, the flow is red and the amount may be similar to a period.  It is common for the flow to start and stop.  In the first few days, you may pass some small clots. Let your nurses know if you begin to pass large clots or your flow increases.  Do not  flush blood clots down the toilet before having the nurse look at them.  During the next 3-10 days after delivery, your flow should become more watery and pink or brown-tinged in color.  Ten to fourteen days after delivery, your flow should be a small amount of yellowish-white discharge.  The amount of your flow will decrease over the first few weeks after delivery. Your flow may stop in 6-8 weeks. Most women have had their flow stop by 12 weeks after delivery.  You should change your sanitary pads frequently.  Wash your hands thoroughly with soap and water for at least 20 seconds after changing pads, using  the toilet, or before holding or feeding your newborn.  You should feel like you need to empty your bladder within the first 6-8 hours after delivery.  In case you become weak, lightheaded, or faint, call your nurse before you get out of bed for the first time and before you take a shower for the first time.  Within the first few days after delivery, your breasts may begin to feel tender and full. This is called engorgement. Breast tenderness usually goes away within 48-72 hours after engorgement occurs. You may also notice milk leaking from your breasts. If you are not breastfeeding, do not stimulate your breasts. Breast stimulation can make your breasts produce more milk.  Your hormones change after delivery. Sometimes the hormone changes can temporarily cause you to feel sad or tearful. These feelings should not last more than a few days. If these feelings last longer than that, you should talk to your caregiver.  If desired, talk to your caregiver about methods of family planning or contraception.   This information is not intended to replace advice given to you by your health care provider. Make sure you discuss any questions you have with your health care provider.   Document Released: 08/25/2007 Document Revised: 07/22/2012 Document Reviewed: 06/24/2012 Elsevier Interactive Patient Education 2016 Elsevier Inc. Cuidados en el postparto luego de un parto vaginal  (Postpartum Care After Vaginal Delivery) Despus  del parto (perodo de postparto), la estada normal en el hospital es de 24-72 horas. Si hubo problemas con el trabajo de parto o el parto, o si tiene otros problemas mdicos, es posible que Hydrologistdeba permanecer en el hospital por ms [place]empo.  Mientras est en el hospital, recibir [place] Grenadinesayuda e instrucciones sobre cmo cuidar de usted misma y de su beb recin nacido durante el postparto.  Mientras est en el hospital:   Asegrese de decirle a las enfermeras si siente dolor o Dentistmalestar, as  como donde Medical laboratory scientific officersiente el dolor y Training and development officerqu empeora el dolor.  Si usted tuvo una incisin cerca de la vagina (episiotoma) o si ha tenido Armed forces technical officeralgn desgarro durante el parto, las enfermeras le pondrn hielo sobre la episiotoma o Art therapistel desgarro. Las bolsas de hielo pueden ayudar a Glass blower/designerreducir el dolor y la hinchazn.  Si est amamantando, puede sentir contracciones dolorosas en el tero durante algunas semanas. Esto es normal. Las contracciones ayudan a que el tero vuelva a su tamao normal.  Es normal tener algo de sangrado despus del [place]abell[place]arto.  Durante los primeros 1-3 das despus del parto, el flujo es de color rojo y la cantidad puede ser similar a un perodo.  Es frecuente que el flujo se inicie y se Chief Strategy Officerdetenga.  En los primeros [place], puede eliminar algunos cogulos pequeos. Informe a las enfermeras si elimina cogulos grandes o aumenta el flujo.  No  elimine los cogulos de sangre por el inodoro antes de que la Johnson Controlsenfermera los vea.  Durante los prximos 3 a [address]10 das despus del parto, el flujo debe ser ms acuoso y rosado o Child psychotherapistmarrn.  Jake Churche diez a catorce American International Groupdas despus del parto, el flujo debe ser una pequea cantidad de secrecin de color blanco amarillento.  La cantidad de flujo disminuir en las primeras semanas despus del parto. El flujo puede detenerse en 6-8 semanas. La mayora de las mujeres no tienen ms flujo a las 12 semanas despus del [place]parto.  Usted debe cambiar sus apsitos con frecuencia.  Lvese bien las manos con agua y jabn durante al menos 20 segundos despus de cambiar el apsito, usar el bao o antes de Occupational psychologistsostener o Corporate treasureralimentar a su recin nacido.  Usted podr sentir como que tiene que vaciar la vejiga durante las primeras 6-8 horas despus del [place]o.  En caso de que sienta debilidad, mareo o Pequ[place] Lakesdesmayo, llame a la enfermera antes de levantarse de la cama por primera vez y antes de tomar una ducha por primera vez.  Dentro de los Allstateprimeros das despus del parto, sus mamas pueden comenzar a estar  sensibles y Eastvalellenas. Esto se llama congestin. La sensibilidad en los senos por lo general desaparece dentro de las 48-72 horas despus de que ocurre la congestin. Tambin puede notar que la [place]leche se escapa de sus senos. Si no est amamantando no estimule sus pechos. La estimulacin de las mamas hace que sus senos produzcan ms Bow[place]leche.  Pasar tanto tiempo como le sea posible con el beb recin nacido es muy importante. Durante ese tiempo, usted y su beb deben sentirse cerca y conocerse uno al otro. Tener al beb en su habitacin (alojamiento conjunto) ayudar a fortalecer el vnculo con el beb recin nacido.Esto le dar tiempo para conocerlo y atenderlo de Lowpointmanera cmoda.  Las hormonas se modifican despus del parto. A veces, los cambios hormonales pueden causar tristeza o ganas de llorar por un tiempo. Estos sentimientos no deben durar ms de Hughes Supplyunos pocos das. Si duran ms que eso, debe hablar  con su mdico.  Si lo desea, hable con su mdico acerca de los mtodos de planificacin familiar o mtodos anticonceptivos.   Esta informacin no tiene Theme park manager el consejo del mdico. Asegrese de hacerle al mdico cualquier pregunta que tenga.   Document Released: 08/25/2007 Document Revised: 07/22/2012 Elsevier Interactive Patient Education [employer]

## 2015-10-31 NOTE — Discharge Summary (Signed)
OB Discharge Summary     Patient Name: Teresa Beck DOB: 1986/06/21 MRN: [id number]  Date of admission: 10/28/2015 Delivering MD: Adam PhenixARNOLD, JAMES G   Date of discharge: 10/31/2015  Admitting diagnosis: 23 WKS, CRAMPS, LEAKING, BLEEDIN Intrauterine pregnancy: 4160w2d     Secondary diagnosis:  Active Problems:   Preterm premature rupture of membranes (PPROM) with unknown onset of labor   Preterm delivery (maternal condition)  Additional problems: Neonatal death of previable infant in NICU within 24 hours of delivery     Discharge diagnosis: Preterm Pregnancy Delivered                                                                                                Post partum procedures:none  Augmentation: none  Complications: No delivery complications, infant death in NICU  Hospital course:  Onset of Labor With Vaginal Delivery     29 y.o. yo [id number] at 5560w2d was admitted in Latent Labor with PPROM on 10/28/2015. Patient had an uncomplicated labor course as follows:  Membrane Rupture Time/Date: 8:00 PM ,10/28/2015   Intrapartum Procedures: Episiotomy: None [1]                                         Lacerations:  None [1]  Patient had a delivery of a 6460w2d infant that went to NICU but did not survive.  Parents were with infant when life support removed and infant passed. Chaplain and social work have spoken with pt.    10/29/2015  Information for the patient's newborn:  Kathleen ArgueRamirez, Girl Caty [[id number]][[id number]]  Delivery Method: Vaginal, Spontaneous Delivery (Filed from Delivery Summary)    Pateint had an uncomplicated postpartum course.  She is ambulating, tolerating a regular diet, passing flatus, and urinating well. Patient is discharged home in stable condition on No discharge date for patient encounter.Marland Kitchen.    Physical exam  Filed Vitals:   10/30/15 0545 10/30/15 1236 10/30/15 1704 10/30/15 2110  BP: 98/50 103/59 100/54 102/47  Pulse: 57 54 69 71  Temp: 97.8 F (36.6 C) 98 F  (36.7 C) 98.5 F (36.9 C) 97.8 F (36.6 C)  TempSrc: Oral Oral Oral Oral  Resp: 18 18 18 18   Height:      Weight:      SpO2: 98% 97% 100% 99%   General: alert and cooperative Lochia: appropriate Uterine Fundus: firm Incision: n/a DVT Evaluation: No evidence of DVT seen on physical exam. Negative Homan's sign. No cords or calf tenderness. No significant calf/ankle edema. Labs: Lab Results  Component Value Date   WBC 17.1* 10/28/2015   HGB 10.2* 10/28/2015   HCT 30.0* 10/28/2015   MCV 87.7 10/28/2015   PLT 262 10/28/2015   CMP Latest Ref Rng 05/13/2014  Glucose 70 - 99 mg/dL 88  BUN 6 - 23 mg/dL 13  Creatinine 1.300.50 - 8.651.10 mg/dL 7.840.62  Sodium 696137 - 295147 mEq/L 137  Potassium 3.7 - 5.3 mEq/L 4.4  Chloride 96 - 112 mEq/L 101  CO2 19 - 32 mEq/L  25  Calcium 8.4 - 10.5 mg/dL 9.8  Total Protein 6.0 - 8.3 g/dL 4.0(J)  Total Bilirubin 0.3 - 1.2 mg/dL 8.1(X)  Alkaline Phos 39 - 117 U/L 83  AST 0 - 37 U/L 10  ALT 0 - 35 U/L 10    Discharge instruction: per After Visit Summary and "Baby and Me Booklet".  After visit meds:    Medication List    STOP taking these medications        cephALEXin 500 MG capsule  Commonly known as:  KEFLEX     cyclobenzaprine 10 MG tablet  Commonly known as:  FLEXERIL      TAKE these medications        ibuprofen 600 MG tablet  Commonly known as:  ADVIL,MOTRIN  Take 1 tablet (600 mg total) by mouth every 6 (six) hours.     prenatal multivitamin Tabs tablet  Take 1 tablet by mouth daily at 12 noon.        Diet: routine diet  Activity: Advance as tolerated. Pelvic rest for 6 weeks.   Outpatient follow up:4-6 weeks with [facility] Follow up Appt:No future appointments. Follow up Visit:No Follow-up on file.   Pt reports she has family and spiritual support at home prior to discharge.  She and her husband are tearful and mourning appropriately during our visit this morning.  Postpartum contraception: None  Newborn Data: Live born female   Birth Weight: 1 lb 0.2 oz (460 g) APGAR: , 8  Baby Feeding: n/a Disposition:morgue   10/31/2015 LEFTWICH-KIRBY, Lashica Hannay, CNM

## 2015-10-31 NOTE — Progress Notes (Signed)
[facility] to be present at the pending death of Teresa Beck, daughter of Teresa Beck. Although the family are PACCAR Incoman Catholic and chaplain offered to call the on-call Lucent Technologiesoman Catholic Priest, the parents requested that the chaplain, an ordained Protestant Clergy, to baptize the child. Using sterile water, [name] was baptized into the RadioShackChristian faith. Shortly thereafter, Teresa Beck was disconnected from life support and died. Teresa Beck speaks English, but her husband, does not. The interpreter was present throughout to provide realtime translations of every verbal exchange with medical and spiritual care providers.  Benjie Karvonenharles D. Deanette Tullius, DMin, MDiv Chaplain

## 2015-11-06 ENCOUNTER — Encounter (HOSPITAL_COMMUNITY): Payer: Self-pay | Admitting: *Deleted

## 2016-01-27 ENCOUNTER — Encounter (HOSPITAL_COMMUNITY): Payer: Self-pay | Admitting: *Deleted

## 2016-01-27 ENCOUNTER — Inpatient Hospital Stay (HOSPITAL_COMMUNITY)
Admission: AD | Admit: 2016-01-27 | Discharge: 2016-01-28 | Disposition: A | Discharge status: Home / Self Care | Payer: Medicaid Other | Source: Ambulatory Visit | Attending: Obstetrics & Gynecology | Admitting: Obstetrics & Gynecology

## 2016-01-27 DIAGNOSIS — N390 Urinary tract infection, site not specified: Secondary | ICD-10-CM

## 2016-01-27 LAB — WET PREP, GENITAL
Clue Cells Wet Prep HPF POC: NONE SEEN
Sperm: NONE SEEN
Trich, Wet Prep: NONE SEEN
Yeast Wet Prep HPF POC: NONE SEEN

## 2016-01-27 LAB — URINE MICROSCOPIC-ADD ON: RBC / HPF: NONE SEEN RBC/hpf (ref 0–5)

## 2016-01-27 LAB — URINALYSIS, ROUTINE W REFLEX MICROSCOPIC
Bilirubin Urine: NEGATIVE
Glucose, UA: NEGATIVE mg/dL
KETONES UR: NEGATIVE mg/dL
NITRITE: NEGATIVE
PROTEIN: NEGATIVE mg/dL
Specific Gravity, Urine: 1.01 (ref 1.005–1.030)
pH: 6.5 (ref 5.0–8.0)

## 2016-01-27 LAB — POCT PREGNANCY, URINE: PREG TEST UR: NEGATIVE

## 2016-01-27 NOTE — MAU Note (Signed)
Pt reports abd pain that feels like contractions off/on x one week. Reports her LMP 01/22/2016. Denies nausea, vomiting, diarrhea but reports fever of 104 on Wednesday. Also reports swelling in her hands and feet.

## 2016-01-27 NOTE — Progress Notes (Signed)
Assisted RN with triage with interpretation of medical questions.  Spanish interpreter

## 2016-01-27 NOTE — Progress Notes (Signed)
Assisted RN with interpretation of medical questions.  Spanish interpreter

## 2016-01-28 DIAGNOSIS — N390 Urinary tract infection, site not specified: Secondary | ICD-10-CM

## 2016-01-28 LAB — CBC
HCT: 34.6 % — ABNORMAL LOW (ref 36.0–46.0)
Hemoglobin: 11.9 g/dL — ABNORMAL LOW (ref 12.0–15.0)
MCH: 29.5 pg (ref 26.0–34.0)
MCHC: 34.4 g/dL (ref 30.0–36.0)
MCV: 85.9 fL (ref 78.0–100.0)
Platelets: 285 10*3/uL (ref 150–400)
RBC: 4.03 MIL/uL (ref 3.87–5.11)
RDW: 14.2 % (ref 11.5–15.5)
WBC: 7.8 10*3/uL (ref 4.0–10.5)

## 2016-01-28 MED ORDER — NITROFURANTOIN MONOHYD MACRO 100 MG PO CAPS: 100.0000 mg | ORAL_CAPSULE | Freq: Two times a day (BID) | ORAL | Status: AC

## 2016-01-28 NOTE — MAU Provider Note (Signed)
History     CSN: [id number]  Arrival date and time: 01/27/16 2218   First Provider Initiated Contact with Patient 01/27/16 2248      No chief complaint on file.  HPI Teresa Beck 30 y.o. [id number] presents to MAU with the complaints of "contractions and her stomach getting hard" she also complains of burning and discomfort with she urinates. Pt states that she had a premature baby that died at 23 weeks and today was her due date. Interpreter is present   Past Medical History  Diagnosis Date  . Ovarian cyst   . Chronic kidney disease     stones and infection    Past Surgical History  Procedure Laterality Date  . No past surgeries      History reviewed. No pertinent family history.  Social History  Substance Use Topics  . Smoking status: Never Smoker   . Smokeless tobacco: None  . Alcohol Use: Yes     Comment: not while pregnant    Allergies: No Known Allergies  Prescriptions prior to admission  Medication Sig Dispense Refill Last Dose  . acetaminophen (TYLENOL) 325 MG tablet Take 650 mg by mouth every 6 (six) hours as needed.   01/27/2016 at 0700  . ibuprofen (ADVIL,MOTRIN) 600 MG tablet Take 1 tablet (600 mg total) by mouth every 6 (six) hours. 30 tablet 0 Unknown at Unknown time  . Prenatal Vit-Fe Fumarate-FA (PRENATAL MULTIVITAMIN) TABS tablet Take 1 tablet by mouth daily at 12 noon.   10/28/2015 at Unknown time    Review of Systems  Constitutional: Negative for fever.  Gastrointestinal: Positive for abdominal pain. Negative for diarrhea and constipation.       Suprapubic abdominal pain  Genitourinary: Positive for dysuria. Negative for flank pain.  Musculoskeletal: Negative for back pain.  All other systems reviewed and are negative.  Physical Exam   Blood pressure 108/66, pulse 73, temperature 98.5 F (36.9 C), temperature source Oral, resp. rate 18, height  (1.575 m), weight 189 lb (85.73 kg), last menstrual period 01/22/2016, SpO2 99 %, unknown if  currently breastfeeding.  Physical Exam  Nursing note and vitals reviewed. Constitutional: She is oriented to person, place, and time. She appears well-developed and well-nourished.  HENT:  Head: Normocephalic and atraumatic.  Neck: Normal range of motion.  Cardiovascular: Normal rate and regular rhythm.   Respiratory: Breath sounds normal. She is in respiratory distress.  GI: Soft. She exhibits no distension. There is tenderness.  suprapubic  Musculoskeletal: Normal range of motion.  Neurological: She is alert and oriented to person, place, and time.  Skin: Skin is warm and dry.  Psychiatric: She has a normal mood and affect. Her behavior is normal.   Results for orders placed or performed during the hospital encounter of 01/27/16 (from the past 24 hour(s))  Urinalysis, Routine w reflex microscopic (not at [facility])     Status: Abnormal   Collection Time: 01/27/16 10:35 PM  Result Value Ref Range   Color, Urine YELLOW YELLOW   APPearance CLEAR CLEAR   Specific Gravity, Urine 1.010 1.005 - 1.030   pH 6.5 5.0 - 8.0   Glucose, UA NEGATIVE NEGATIVE mg/dL   Hgb urine dipstick TRACE (A) NEGATIVE   Bilirubin Urine NEGATIVE NEGATIVE   Ketones, ur NEGATIVE NEGATIVE mg/dL   Protein, ur NEGATIVE NEGATIVE mg/dL   Nitrite NEGATIVE NEGATIVE   Leukocytes, UA TRACE (A) NEGATIVE  Urine microscopic-add on     Status: Abnormal   Collection Time: 01/27/16 10:35 PM  Result Value Ref Range   Squamous Epithelial / LPF 0-5 (A) NONE SEEN   WBC, UA 6-30 0 - 5 WBC/hpf   RBC / HPF NONE SEEN 0 - 5 RBC/hpf   Bacteria, UA FEW (A) NONE SEEN   Urine-Other MUCOUS PRESENT   Pregnancy, urine POC     Status: None   Collection Time: 01/27/16 10:44 PM  Result Value Ref Range   Preg Test, Ur NEGATIVE NEGATIVE  Wet prep, genital     Status: Abnormal   Collection Time: 01/27/16 11:20 PM  Result Value Ref Range   Yeast Wet Prep HPF POC NONE SEEN NONE SEEN   Trich, Wet Prep NONE SEEN NONE SEEN   Clue Cells Wet  Prep HPF POC NONE SEEN NONE SEEN   WBC, Wet Prep HPF POC MODERATE (A) NONE SEEN   Sperm NONE SEEN    Results for orders placed or performed during the hospital encounter of 01/27/16 (from the past 24 hour(s))  Urinalysis, Routine w reflex microscopic (not at [facility])     Status: Abnormal   Collection Time: 01/27/16 10:35 PM  Result Value Ref Range   Color, Urine YELLOW YELLOW   APPearance CLEAR CLEAR   Specific Gravity, Urine 1.010 1.005 - 1.030   pH 6.5 5.0 - 8.0   Glucose, UA NEGATIVE NEGATIVE mg/dL   Hgb urine dipstick TRACE (A) NEGATIVE   Bilirubin Urine NEGATIVE NEGATIVE   Ketones, ur NEGATIVE NEGATIVE mg/dL   Protein, ur NEGATIVE NEGATIVE mg/dL   Nitrite NEGATIVE NEGATIVE   Leukocytes, UA TRACE (A) NEGATIVE  Urine microscopic-add on     Status: Abnormal   Collection Time: 01/27/16 10:35 PM  Result Value Ref Range   Squamous Epithelial / LPF 0-5 (A) NONE SEEN   WBC, UA 6-30 0 - 5 WBC/hpf   RBC / HPF NONE SEEN 0 - 5 RBC/hpf   Bacteria, UA FEW (A) NONE SEEN   Urine-Other MUCOUS PRESENT   Pregnancy, urine POC     Status: None   Collection Time: 01/27/16 10:44 PM  Result Value Ref Range   Preg Test, Ur NEGATIVE NEGATIVE  Wet prep, genital     Status: Abnormal   Collection Time: 01/27/16 11:20 PM  Result Value Ref Range   Yeast Wet Prep HPF POC NONE SEEN NONE SEEN   Trich, Wet Prep NONE SEEN NONE SEEN   Clue Cells Wet Prep HPF POC NONE SEEN NONE SEEN   WBC, Wet Prep HPF POC MODERATE (A) NONE SEEN   Sperm NONE SEEN   CBC     Status: Abnormal   Collection Time: 01/28/16 12:26 AM  Result Value Ref Range   WBC 7.8 4.0 - 10.5 K/uL   RBC 4.03 3.87 - 5.11 MIL/uL   Hemoglobin 11.9 (L) 12.0 - 15.0 g/dL   HCT 29.5 (L) 62.1 - 30.8 %   MCV 85.9 78.0 - 100.0 fL   MCH 29.5 26.0 - 34.0 pg   MCHC 34.4 30.0 - 36.0 g/dL   RDW 65.7 84.6 - 96.2 %   Platelets 285 150 - 400 K/uL   MAU Course  Procedures  MDM Pt describes symptoms like labor symptoms and made a comment about her baby's  due date was today. She is not on birth control, her pregnancy test was negative and urine with trace hgb and trace leuks; will send off for culture. Will treat with macrobid . Wet prep negative for trich, yeast and clue cells.     Assessment and Plan  UTI Macrobid 100mg  po BID x 7 days Discharge   Ascher Schroepfer Grissett 01/28/2016, 12:16 AM

## 2016-01-28 NOTE — Discharge Instructions (Signed)
Infeccin urinaria  (Urinary Tract Infection)  La infeccin urinaria puede ocurrir en Corporate treasurercualquier lugar del tracto urinario. El tracto urinario es un sistema de drenaje del cuerpo por el que se eliminan los desechos y el exceso de [place]. El tracto urinario est formado por dos riones, dos urteres, la vejiga y Engineer, miningla uretra. Los riones son rganos que tienen forma de frijol. Cada rin tiene aproximadamente el tamao del puo. Estn situados debajo de las [place]illas, uno a cada lado de la columna vertebral CAUSAS  La causa de la infeccin son los microbios, que son organismos microscpicos, que incluyen hongos, virus, y bacterias. Estos organismos son tan pequeos que slo pueden verse a travs del microscopio. Las bacterias son los microorganismos que ms comnmente causan infecciones urinarias.  SNTOMAS  Los sntomas pueden variar segn la edad y el sexo del paciente y por la ubicacin de la infeccin. Los sntomas en las mujeres jvenes incluyen la necesidad frecuente e intensa de orinar y una sensacin dolorosa de ardor en la vejiga o en la uretra durante la miccin. Las mujeres y los hombres mayores podrn sentir cansancio, temblores y debilidad y Futures tradersentir dolores musculares y Engineer, miningdolor abdominal. Si tiene Peckfiebre, puede significar que la infeccin est en los riones. Otros sntomas son dolor en la espalda o en los lados debajo de las St. Mary[place]ostillas, nuseas y vmitos.  DIAGNSTICO  Para diagnosticar una infeccin urinaria, el mdico le preguntar acerca de sus sntomas. Genuine Partsambin le solicitar una Heritage Lakemuestra de [place]. La muestra de orina se analiza para Engineer, manufacturingdetectar bacterias y glbulos blancos de Risk managerla sangre. Los glbulos blancos se forman en el organismo para ayudar a Artistcombatir las infecciones.  TRATAMIENTO  Por lo general, las infecciones urinarias pueden tratarse con medicamentos. Debido a que la Harley-Davidsonmayora de las infecciones son causadas por bacterias, por lo general pueden tratarse con antibiticos. La eleccin del  antibitico y la duracin del tratamiento depender de sus sntomas y el tipo de bacteria causante de la infeccin.  INSTRUCCIONES PARA EL CUIDADO EN EL HOGAR   Si le recetaron antibiticos, tmelos exactamente como su mdico le indique. Termine el medicamento aunque se sienta mejor despus de haber tomado slo algunos.  Beba gran cantidad de lquido para mantener la orina de tono claro o color amarillo plido.  Evite la cafena, el t y las [address] Hospital Placebebidas gaseosas. Estas sustancias irritan la vejiga.  Vaciar la vejiga con frecuencia. Evite retener la orina durante largos perodos.  Vace la vejiga antes y despus de Management consultanttener relaciones sexuales.  Despus de mover el intestino, las mujeres deben higienizarse la regin perineal desde adelante hacia atrs. Use slo un papel tissue por vez. SOLICITE ATENCIN MDICA SI:   Siente dolor en la espalda.  Le sube la fiebre.  Los sntomas no mejoran luego de [address] das. SOLICITE ATENCIN MDICA DE INMEDIATO SI:   Siente dolor intenso en la espalda o en la zona inferior del abdomen.  Comienza a sentir escalofros.  Tiene nuseas o vmitos.  Tiene una sensacin continua de quemazn o molestias al ConocoPhillipsorinar. ASEGRESE DE QUE:   Comprende estas instrucciones.  Controlar su enfermedad.  Solicitar ayuda de inmediato si no mejora o empeora.   Esta informacin no tiene Theme park managercomo fin reemplazar el consejo del mdico. Asegrese de hacerle al mdico cualquier pregunta que tenga.   Document Released: 08/07/2005 Document Revised: 07/22/2012 Elsevier Interactive Patient Education 2016 [employer]vier Inc. Nitrofurantoin tablets or capsules Qu es este medicamento? La NITROFURANTONA es un antibitico. Se utiliza en el tratamiento de la infecciones del  tracto urinario. Este medicamento puede ser utilizado para otros usos; si tiene alguna pregunta consulte con su proveedor de atencin mdica o con su farmacutico. Qu le debo informar a mi profesional de la salud antes  de tomar este medicamento? Necesita saber si usted presenta alguno de los [employer] o situaciones: -anemia -diabetes -deficiencia de glucosa-6-fosfato deshidrogenasa -enfermedad renal -enfermedad heptica -enfermedad pulmonar -otras enfermedades crnicas -una reaccin alrgica o inusual a la nitrofurantona, a otros antibiticos, a otros medicamentos, alimentos, colorantes o conservantes -si est embarazada o buscando quedar embarazada -si est amamantando a un beb Cmo debo utilizar este medicamento? Tome este medicamento por va oral con un vaso de agua. Siga las instrucciones de la etiqueta del [place]. Tome este medicamento con leche o con alimentos. Tome sus dosis a intervalos regulares. No tome su medicamento con una frecuencia mayor que la indicada. No deje de tomarlo excepto si as lo indica su mdico. Hable con su pediatra para informarse acerca del uso de este medicamento en nios. Aunque este medicamento se puede recetar para condiciones selectivas, las precauciones se aplican. Sobredosis: Pngase en contacto inmediatamente con un centro toxicolgico o una sala de urgencia si usted cree que haya tomado demasiado medicamento. ATENCIN: [employer] es solo para usted. No comparta este medicamento con nadie. Qu sucede si me olvido de una dosis? Si olvida una dosis, tmela lo antes posible. Si es casi la hora de la prxima dosis, tome slo esa dosis. No tome dosis adicionales o dobles. Qu puede interactuar con este medicamento? -anticidos que contienen trisilicato de magnesio -probenecid -antibiticos quinolnicos, tales como ciprofloxacina, lomefloxacino, norfloxacino y ofloxacino -sulfapirazona Puede ser que esta lista no menciona todas las posibles interacciones. Informe a su profesional de [employer] de [employer] productos a base de hierbas, medicamentos de [place] o suplementos nutritivos que est tomando. Si usted fuma, consume bebidas alcohlicas o  si utiliza drogas ilegales, indqueselo tambin a su profesional de [employer]. Algunas sustancias pueden interactuar con su medicamento. A qu debo estar atento al usar [employer] Corporation? Si los sntomas no mejoran o si experimenta nuevos sntomas, consulte con su mdico o su profesional de [employer]. Beba varios vasos de Warehouse manager. Si toma este medicamento durante un perodo de Google, debe visitar a su mdico para chequear su evolucin peridicamente. Si es diabtico, podr Barista un resultado positivo falso en los [employer] de determinacin del nivel de azcar en la orina con algunas marcas de [place] de [place]. Consulte con su mdico. Qu efectos secundarios puedo tener al [employer] este medicamento? Efectos secundarios que debe informar a su mdico o a Producer, television/film/video de la salud tan pronto como sea posible: -Therapist, art como erupcin cutnea o urticarias, hinchazn de la cara, labios o lengua -dolor en el pecho -tos -dificultad al respirar -mareos, somnolencia -fiebre o infeccin -molestias o dolores articulares -piel plida o teida azul -enrojecimiento, formacin de ampollas, descamacin o aflojamiento de la piel, inclusive dentro de la boca -hormigueo, ardor, Engineer, mining o entumecimiento de las manos o los pies -sangrado o magulladuras inusuales -cansancio o debilidad inusual -color amarillento de ojos o piel Efectos secundarios que, por lo general, no requieren Psychologist, prison and probation services (debe informarlos a su mdico o a su profesional de la salud si persisten o si son molestos): -orina de color amarillo oscuro -diarrea -dolor de cabeza -prdida del apetito -nuseas o vmitos -prdida del cabello temporal Puede ser que esta lista no menciona todos los posibles efectos secundarios. Comunquese a su mdico por asesoramiento  mdico [employer]lett[employer]ard. Usted puede informar los efectos secundarios a la FDA por telfono al 1-800-FDA-1088. Dnde debo guardar mi  medicina? Mantngala fuera del alcance de los nios. Gurdela a Sanmina-SCI, entre 15 y 30 grados C (49 y 58 grados F). Protjala de la luz. Deseche todo el medicamento que no haya utilizado, despus de la fecha de vencimiento. ATENCIN: Este folleto es un resumen. Puede ser que no cubra toda la posible informacin. Si usted tiene preguntas acerca de esta medicina, consulte con su mdico, su farmacutico o su profesional de Radiographer, therapeutic.    2016, Elsevier/Gold Standard. (2014-12-20 00:00:00)

## 2016-01-29 LAB — URINE CULTURE: Special Requests: NORMAL

## 2016-03-20 ENCOUNTER — Ambulatory Visit (INDEPENDENT_AMBULATORY_CARE_PROVIDER_SITE_OTHER): Payer: Self-pay | Admitting: Family Medicine

## 2016-03-20 VITALS — BP 122/80 | HR 68 | Temp 98.9°F | Resp 16 | Ht 62.0 in | Wt 192.0 lb

## 2016-03-20 DIAGNOSIS — J029 Acute pharyngitis, unspecified: Secondary | ICD-10-CM

## 2016-03-20 DIAGNOSIS — I889 Nonspecific lymphadenitis, unspecified: Secondary | ICD-10-CM

## 2016-03-20 MED ORDER — AMOXICILLIN 500 MG PO CAPS: 500.0000 mg | ORAL_CAPSULE | Freq: Three times a day (TID) | ORAL | Status: DC

## 2016-03-20 NOTE — Progress Notes (Signed)
This a 30 year old woman, G3 P2, with an 248 and 30 year old at home and the third child having died in childbirth. She works in [employer]. Her son broke her finger yesterday but otherwise he children are in good health.  Patient has had a couple weeks of sore throat associated with fever, nausea, vomiting, fatigue, and thoracic back pain. She denies any cough. She's had no hemoptysis.  Objective: Patient appears to be in good health and in no acute distress BP 122/80 mmHg  Pulse 68  Temp(Src) 98.9 F (37.2 C) (Oral)  Resp 16  Ht 5\' 2"  (1.575 m)  Wt 192 lb (87.091 kg)  BMI 35.11 kg/m2  SpO2 99%  LMP 03/12/2016 HEENT reveals mildly erythematous throat, swollen glands on the left Neck: Supple Chest: Clear to auscultation Skin: No rash Heart: Regular with no murmur  Assessment: Acute upper respiratory infection is gone on for 2 weeks.  Sore throat - Plan: amoxicillin (AMOXIL) 500 MG capsule  Cervical adenitis - Plan: amoxicillin (AMOXIL) 500 MG capsule  Signed, Elvina SidleKurt Eddy Liszewski M.D.

## 2016-03-20 NOTE — Patient Instructions (Addendum)
  Call if not improving in 2 days

## 2016-04-16 ENCOUNTER — Inpatient Hospital Stay (HOSPITAL_COMMUNITY)
Admission: AD | Admit: 2016-04-16 | Discharge: 2016-04-16 | Disposition: A | Discharge status: Home / Self Care | Payer: Medicaid Other | Source: Ambulatory Visit | Attending: Obstetrics and Gynecology | Admitting: Obstetrics and Gynecology

## 2016-04-16 ENCOUNTER — Encounter (HOSPITAL_COMMUNITY): Payer: Self-pay

## 2016-04-16 DIAGNOSIS — R102 Pelvic and perineal pain: Secondary | ICD-10-CM | POA: Insufficient documentation

## 2016-04-16 DIAGNOSIS — N898 Other specified noninflammatory disorders of vagina: Secondary | ICD-10-CM | POA: Insufficient documentation

## 2016-04-16 HISTORY — DX: Other complications of anesthesia, initial encounter: T88.59XA

## 2016-04-16 HISTORY — DX: Adverse effect of unspecified anesthetic, initial encounter: T41.45XA

## 2016-04-16 LAB — URINALYSIS, ROUTINE W REFLEX MICROSCOPIC
BILIRUBIN URINE: NEGATIVE
GLUCOSE, UA: NEGATIVE mg/dL
Hgb urine dipstick: NEGATIVE
KETONES UR: NEGATIVE mg/dL
Leukocytes, UA: NEGATIVE
Nitrite: NEGATIVE
PH: 6.5 (ref 5.0–8.0)
Protein, ur: NEGATIVE mg/dL

## 2016-04-16 LAB — CBC
HEMATOCRIT: 33.7 % — AB (ref 36.0–46.0)
HEMOGLOBIN: 11.1 g/dL — AB (ref 12.0–15.0)
MCH: 28.4 pg (ref 26.0–34.0)
MCHC: 32.9 g/dL (ref 30.0–36.0)
MCV: 86.2 fL (ref 78.0–100.0)
PLATELETS: 332 10*3/uL (ref 150–400)
RBC: 3.91 MIL/uL (ref 3.87–5.11)
RDW: 15.2 % (ref 11.5–15.5)
WBC: 8.9 10*3/uL (ref 4.0–10.5)

## 2016-04-16 LAB — WET PREP, GENITAL
Clue Cells Wet Prep HPF POC: NONE SEEN
SPERM: NONE SEEN
TRICH WET PREP: NONE SEEN
Yeast Wet Prep HPF POC: NONE SEEN

## 2016-04-16 LAB — POCT PREGNANCY, URINE: Preg Test, Ur: NEGATIVE

## 2016-04-16 MED ORDER — IBUPROFEN 800 MG PO TABS
800.0000 mg | ORAL_TABLET | Freq: Once | ORAL | Status: AC
  Administered 2016-04-16: 800 mg via ORAL
  Filled 2016-04-16: qty 1

## 2016-04-16 MED ORDER — IBUPROFEN 800 MG PO TABS: 800.0000 mg | ORAL_TABLET | Freq: Three times a day (TID) | ORAL | Status: DC | PRN

## 2016-04-16 NOTE — MAU Provider Note (Signed)
History     CSN: [id number]  Arrival date and time: 04/16/16 [id number]   First Provider Initiated Contact with Patient 04/16/16 2027      Chief Complaint  Patient presents with  . Pelvic Pain   Pelvic Pain The patient's primary symptoms include pelvic pain and vaginal discharge. This is a new problem. The current episode started in the past 7 days. The problem occurs constantly. The problem has been gradually worsening. Pain severity now: 7/10  The problem affects both sides. She is not pregnant. Associated symptoms include abdominal pain, dysuria and nausea. Pertinent negatives include no chills, constipation, diarrhea, fever, frequency, urgency or vomiting. The vaginal discharge was white and milky. Nothing aggravates the symptoms. She has tried nothing for the symptoms. She is not sexually active. It is unknown whether or not her partner has an STD. She uses progestin injections for contraception. Her menstrual history has been regular (LMP 04/06/16 ).    Past Medical History  Diagnosis Date  . Ovarian cyst   . Chronic kidney disease     stones and infection  . Complication of anesthesia     Past Surgical History  Procedure Laterality Date  . No past surgeries      History reviewed. No pertinent family history.  Social History  Substance Use Topics  . Smoking status: Never Smoker   . Smokeless tobacco: None  . Alcohol Use: Yes     Comment: not while pregnant    Allergies: No Known Allergies  Prescriptions prior to admission  Medication Sig Dispense Refill Last Dose  . acetaminophen (TYLENOL) 325 MG tablet Take 650 mg by mouth every 6 (six) hours as needed.   Taking  . amoxicillin (AMOXIL) 500 MG capsule Take 1 capsule (500 mg total) by mouth 3 (three) times daily. 30 capsule 0   . ibuprofen (ADVIL,MOTRIN) 600 MG tablet Take 1 tablet (600 mg total) by mouth every 6 (six) hours. 30 tablet 0 Taking    Review of Systems  Constitutional: Negative for fever and chills.   Gastrointestinal: Positive for nausea and abdominal pain. Negative for vomiting, diarrhea and constipation.  Genitourinary: Positive for dysuria, vaginal discharge and pelvic pain. Negative for urgency and frequency.   Physical Exam   Blood pressure 104/69, pulse 64, temperature 98.6 F (37 C), temperature source Oral, resp. rate 16, height 5\' 1"  (1.549 m), weight 83.915 kg (185 lb), last menstrual period 04/06/2016, unknown if currently breastfeeding.  Physical Exam  Nursing note and vitals reviewed. Constitutional: She is oriented to person, place, and time. She appears well-developed and well-nourished. No distress.  HENT:  Head: Normocephalic.  Cardiovascular: Normal rate.   Respiratory: Effort normal.  GI: Soft. There is no tenderness. There is no rebound.  Neurological: She is alert and oriented to person, place, and time.  Skin: Skin is warm and dry.  Psychiatric: She has a normal mood and affect.   Results for orders placed or performed during the hospital encounter of 04/16/16 (from the past 24 hour(s))  Urinalysis, Routine w reflex microscopic ([facility])     Status: Abnormal   Collection Time: 04/16/16  7:55 PM  Result Value Ref Range   Color, Urine YELLOW YELLOW   APPearance CLEAR CLEAR   Specific Gravity, Urine <1.005 (L) 1.005 - 1.030   pH 6.5 5.0 - 8.0   Glucose, UA NEGATIVE NEGATIVE mg/dL   Hgb urine dipstick NEGATIVE NEGATIVE   Bilirubin Urine NEGATIVE NEGATIVE   Ketones, ur NEGATIVE NEGATIVE mg/dL  Protein, ur NEGATIVE NEGATIVE mg/dL   Nitrite NEGATIVE NEGATIVE   Leukocytes, UA NEGATIVE NEGATIVE  Pregnancy, urine POC     Status: None   Collection Time: 04/16/16  8:21 PM  Result Value Ref Range   Preg Test, Ur NEGATIVE NEGATIVE  Wet prep, genital     Status: Abnormal   Collection Time: 04/16/16  8:35 PM  Result Value Ref Range   Yeast Wet Prep HPF POC NONE SEEN NONE SEEN   Trich, Wet Prep NONE SEEN NONE SEEN   Clue Cells Wet Prep HPF POC NONE SEEN  NONE SEEN   WBC, Wet Prep HPF POC FEW BACTERIA SEEN (A) NONE SEEN   Sperm NONE SEEN   CBC     Status: Abnormal   Collection Time: 04/16/16  8:39 PM  Result Value Ref Range   WBC 8.9 4.0 - 10.5 K/uL   RBC 3.91 3.87 - 5.11 MIL/uL   Hemoglobin 11.1 (L) 12.0 - 15.0 g/dL   HCT 16.1 (L) 09.6 - 04.5 %   MCV 86.2 78.0 - 100.0 fL   MCH 28.4 26.0 - 34.0 pg   MCHC 32.9 30.0 - 36.0 g/dL   RDW 40.9 81.1 - 91.4 %   Platelets 332 150 - 400 K/uL    MAU Course  Procedures  MDM Patient has had ibuprofen. She reports that her pain is better.   Assessment and Plan   1. Pelvic pain in female    DC home Comfort measures reviewed  RX: ibuprofen  PRN #[id number]  Return to MAU as needed   Follow-up Information    Follow up with [facility].   Why:  As scheduled   Contact information:   [address] [place] [address] [phone]         Tawnya Crook 04/16/2016, 8:31 PM

## 2016-04-16 NOTE — Discharge Instructions (Signed)

## 2016-04-16 NOTE — MAU Note (Signed)
Has had abdominal pain x 3 days.  Radiates to back.  Has noticed abdominal distention. Has had a BM this morning.  Also noticed white vaginal discharge.

## 2016-04-17 LAB — RPR: RPR Ser Ql: NONREACTIVE

## 2016-04-17 LAB — HIV ANTIBODY (ROUTINE TESTING W REFLEX): HIV SCREEN 4TH GENERATION: NONREACTIVE

## 2016-04-17 LAB — GC/CHLAMYDIA PROBE AMP (~~LOC~~) NOT AT ARMC
CHLAMYDIA, DNA PROBE: NEGATIVE
NEISSERIA GONORRHEA: NEGATIVE

## 2016-04-18 LAB — URINE CULTURE

## 2016-07-03 ENCOUNTER — Encounter (HOSPITAL_COMMUNITY): Payer: Self-pay | Admitting: *Deleted

## 2016-07-03 ENCOUNTER — Inpatient Hospital Stay (HOSPITAL_COMMUNITY)
Admission: AD | Admit: 2016-07-03 | Discharge: 2016-07-04 | Discharge status: Against Medical Advice | Payer: Medicaid Other | Source: Ambulatory Visit | Attending: Obstetrics & Gynecology | Admitting: Obstetrics & Gynecology

## 2016-07-03 DIAGNOSIS — R109 Unspecified abdominal pain: Secondary | ICD-10-CM | POA: Insufficient documentation

## 2016-07-03 LAB — URINALYSIS, ROUTINE W REFLEX MICROSCOPIC
Bilirubin Urine: NEGATIVE
GLUCOSE, UA: NEGATIVE mg/dL
Hgb urine dipstick: NEGATIVE
Ketones, ur: NEGATIVE mg/dL
LEUKOCYTES UA: NEGATIVE
Nitrite: NEGATIVE
PROTEIN: NEGATIVE mg/dL
Specific Gravity, Urine: 1.01 (ref 1.005–1.030)
pH: 5.5 (ref 5.0–8.0)

## 2016-07-03 LAB — POCT PREGNANCY, URINE: PREG TEST UR: NEGATIVE

## 2016-07-03 NOTE — MAU Note (Signed)
Pt reports pain in her lower abd radiating to her right side x one week. Pain is worsening tonight. Some pain and burning with urination.

## 2016-07-04 NOTE — MAU Note (Signed)
Pt not in room.

## 2016-10-16 ENCOUNTER — Encounter (HOSPITAL_COMMUNITY): Payer: Self-pay

## 2016-10-16 ENCOUNTER — Inpatient Hospital Stay (HOSPITAL_COMMUNITY): Payer: Self-pay

## 2016-10-16 ENCOUNTER — Inpatient Hospital Stay (HOSPITAL_COMMUNITY)
Admission: AD | Admit: 2016-10-16 | Discharge: 2016-10-16 | Disposition: A | Discharge status: Home / Self Care | Payer: Self-pay | Source: Ambulatory Visit | Attending: Family Medicine | Admitting: Family Medicine

## 2016-10-16 DIAGNOSIS — R109 Unspecified abdominal pain: Secondary | ICD-10-CM

## 2016-10-16 DIAGNOSIS — K5900 Constipation, unspecified: Secondary | ICD-10-CM

## 2016-10-16 DIAGNOSIS — R1030 Lower abdominal pain, unspecified: Secondary | ICD-10-CM

## 2016-10-16 DIAGNOSIS — N189 Chronic kidney disease, unspecified: Secondary | ICD-10-CM | POA: Insufficient documentation

## 2016-10-16 DIAGNOSIS — R1031 Right lower quadrant pain: Secondary | ICD-10-CM

## 2016-10-16 LAB — CBC WITH DIFFERENTIAL/PLATELET
BASOS PCT: 1 %
Basophils Absolute: 0 10*3/uL (ref 0.0–0.1)
EOS ABS: 0.2 10*3/uL (ref 0.0–0.7)
Eosinophils Relative: 2 %
HEMATOCRIT: 34.6 % — AB (ref 36.0–46.0)
HEMOGLOBIN: 11.6 g/dL — AB (ref 12.0–15.0)
LYMPHS ABS: 2.5 10*3/uL (ref 0.7–4.0)
Lymphocytes Relative: 33 %
MCH: 29.2 pg (ref 26.0–34.0)
MCHC: 33.5 g/dL (ref 30.0–36.0)
MCV: 87.2 fL (ref 78.0–100.0)
Monocytes Absolute: 0.5 10*3/uL (ref 0.1–1.0)
Monocytes Relative: 6 %
NEUTROS ABS: 4.3 10*3/uL (ref 1.7–7.7)
NEUTROS PCT: 58 %
Platelets: 309 10*3/uL (ref 150–400)
RBC: 3.97 MIL/uL (ref 3.87–5.11)
RDW: 14.5 % (ref 11.5–15.5)
WBC: 7.4 10*3/uL (ref 4.0–10.5)

## 2016-10-16 LAB — URINALYSIS, ROUTINE W REFLEX MICROSCOPIC
Bilirubin Urine: NEGATIVE
Glucose, UA: NEGATIVE mg/dL
Hgb urine dipstick: NEGATIVE
KETONES UR: NEGATIVE mg/dL
LEUKOCYTES UA: NEGATIVE
NITRITE: NEGATIVE
PH: 6 (ref 5.0–8.0)
PROTEIN: NEGATIVE mg/dL
Specific Gravity, Urine: 1.004 — ABNORMAL LOW (ref 1.005–1.030)

## 2016-10-16 LAB — POCT PREGNANCY, URINE: Preg Test, Ur: NEGATIVE

## 2016-10-16 LAB — WET PREP, GENITAL
Clue Cells Wet Prep HPF POC: NONE SEEN
SPERM: NONE SEEN
TRICH WET PREP: NONE SEEN
YEAST WET PREP: NONE SEEN

## 2016-10-16 MED ORDER — FLEET ENEMA 7-19 GM/118ML RE ENEM: 1.0000 | ENEMA | Freq: Once | RECTAL | Status: DC

## 2016-10-16 MED ORDER — KETOROLAC TROMETHAMINE 60 MG/2ML IM SOLN
60.0000 mg | Freq: Once | INTRAMUSCULAR | Status: AC
  Administered 2016-10-16: 60 mg via INTRAMUSCULAR
  Filled 2016-10-16: qty 2

## 2016-10-16 NOTE — MAU Provider Note (Signed)
History     CSN: [id number]  Arrival date and time: 10/16/16 1443   First Provider Initiated Contact with Patient 10/16/16 1531      Chief Complaint  Patient presents with  . Abdominal Pain  . Fever   HPI   Teresa Beck is a 30 y.o. female [phone] here in MAU with abdominal pain. The pain is located all over her lower abdomen, and worse in the right lower quadrant. The pain started 3 days ago. " I feel constipated but I'm not constipated" She does attests to hard stools, making it difficult to pass.   She took 1 tylenol this morning for fever and pain.  She states she has never had this pain before. She does have a history of ovarian cysts.   OB History    Gravida Para Term Preterm AB Living   3 3 1 2  0 3   SAB TAB Ectopic Multiple Live Births   0 0 0 0 3      Past Medical History:  Diagnosis Date  . Chronic kidney disease    stones and infection  . Complication of anesthesia   . Ovarian cyst     Past Surgical History:  Procedure Laterality Date  . NO PAST SURGERIES      History reviewed. No pertinent family history.  Social History  Substance Use Topics  . Smoking status: Never Smoker  . Smokeless tobacco: Never Used  . Alcohol use Yes     Comment: not while pregnant    Allergies: No Known Allergies  Prescriptions Prior to Admission  Medication Sig Dispense Refill Last Dose  . albuterol (PROVENTIL HFA;VENTOLIN HFA) 108 (90 Base) MCG/ACT inhaler Inhale 1 puff into the lungs every 6 (six) hours as needed for wheezing or shortness of breath.   10/15/2016 at Unknown time  . ibuprofen (ADVIL,MOTRIN) 800 MG tablet Take 1 tablet (800 mg total) by mouth every 8 (eight) hours as needed. (Patient not taking: Reported on 10/16/2016) 30 tablet 0 Completed Course at Unknown time   Results for orders placed or performed during the hospital encounter of 10/16/16 (from the past 48 hour(s))  Urinalysis, Routine w reflex microscopic     Status: Abnormal   Collection  Time: 10/16/16  3:01 PM  Result Value Ref Range   Color, Urine STRAW (A) YELLOW   APPearance CLEAR CLEAR   Specific Gravity, Urine 1.004 (L) 1.005 - 1.030   pH 6.0 5.0 - 8.0   Glucose, UA NEGATIVE NEGATIVE mg/dL   Hgb urine dipstick NEGATIVE NEGATIVE   Bilirubin Urine NEGATIVE NEGATIVE   Ketones, ur NEGATIVE NEGATIVE mg/dL   Protein, ur NEGATIVE NEGATIVE mg/dL   Nitrite NEGATIVE NEGATIVE   Leukocytes, UA NEGATIVE NEGATIVE  Pregnancy, urine POC     Status: None   Collection Time: 10/16/16  3:08 PM  Result Value Ref Range   Preg Test, Ur NEGATIVE NEGATIVE    Comment:        THE SENSITIVITY OF THIS METHODOLOGY IS >24 mIU/mL   CBC with Differential     Status: Abnormal   Collection Time: 10/16/16  3:44 PM  Result Value Ref Range   WBC 7.4 4.0 - 10.5 K/uL   RBC 3.97 3.87 - 5.11 MIL/uL   Hemoglobin 11.6 (L) 12.0 - 15.0 g/dL   HCT 13.034.6 (L) 86.536.0 - 78.446.0 %   MCV 87.2 78.0 - 100.0 fL   MCH 29.2 26.0 - 34.0 pg   MCHC 33.5 30.0 - 36.0 g/dL  RDW 14.5 11.5 - 15.5 %   Platelets 309 150 - 400 K/uL   Neutrophils Relative % 58 %   Neutro Abs 4.3 1.7 - 7.7 K/uL   Lymphocytes Relative 33 %   Lymphs Abs 2.5 0.7 - 4.0 K/uL   Monocytes Relative 6 %   Monocytes Absolute 0.5 0.1 - 1.0 K/uL   Eosinophils Relative 2 %   Eosinophils Absolute 0.2 0.0 - 0.7 K/uL   Basophils Relative 1 %   Basophils Absolute 0.0 0.0 - 0.1 K/uL  Wet prep, genital     Status: Abnormal   Collection Time: 10/16/16  4:51 PM  Result Value Ref Range   Yeast Wet Prep HPF POC NONE SEEN NONE SEEN   Trich, Wet Prep NONE SEEN NONE SEEN   Clue Cells Wet Prep HPF POC NONE SEEN NONE SEEN   WBC, Wet Prep HPF POC FEW (A) NONE SEEN   Sperm NONE SEEN    US Transvaginal Non-ob  Result Date: 10/16/2016 CLINICAL DATA:  Right lower quadrant pain. EXAM: TRANSABDOMINAL AND TRANSVAGINAL ULTRASOUND OF PELVIS TECHNIQUE: Both transabdominal and transvaginal ultrasound examinations of the pelvis were performed. Transabdominal technique was  performed for global imaging of the pelvis including uterus, ovaries, adnexal regions, and pelvic cul-de-sac. It was necessary to proceed with endovaginal exam following the transabdominal exam to visualize the ovaries. COMPARISON:  None FINDINGS: Uterus Measurements: 8 x 4.6 x 6.8 cm. No fibroids or other mass visualized. Question arcuate anatomy. Endometrium Thickness: 14 mm.  No focal abnormality visualized. Right ovary Measurements: 3.7 x 2.9 x 2.3 cm. Normal appearance/no adnexal mass. Left ovary Measurements: 2.8 x 2.2 x 1.9 cm. Normal appearance/no adnexal mass. Other findings No abnormal free fluid. IMPRESSION: Unremarkable ultrasound exam of the pelvis. Electronically Signed   By: Kennith Center M.D.   On: 10/16/2016 18:02   US Pelvis Complete  Result Date: 10/16/2016 CLINICAL DATA:  Right lower quadrant pain. EXAM: TRANSABDOMINAL AND TRANSVAGINAL ULTRASOUND OF PELVIS TECHNIQUE: Both transabdominal and transvaginal ultrasound examinations of the pelvis were performed. Transabdominal technique was performed for global imaging of the pelvis including uterus, ovaries, adnexal regions, and pelvic cul-de-sac. It was necessary to proceed with endovaginal exam following the transabdominal exam to visualize the ovaries. COMPARISON:  None FINDINGS: Uterus Measurements: 8 x 4.6 x 6.8 cm. No fibroids or other mass visualized. Question arcuate anatomy. Endometrium Thickness: 14 mm.  No focal abnormality visualized. Right ovary Measurements: 3.7 x 2.9 x 2.3 cm. Normal appearance/no adnexal mass. Left ovary Measurements: 2.8 x 2.2 x 1.9 cm. Normal appearance/no adnexal mass. Other findings No abnormal free fluid. IMPRESSION: Unremarkable ultrasound exam of the pelvis. Electronically Signed   By: Kennith Center M.D.   On: 10/16/2016 18:02   Dg Abd 2 Views  Result Date: 10/16/2016 CLINICAL DATA:  Right lower quadrant pain x2 days ago. Pain with urination. EXAM: ABDOMEN - 2 VIEW COMPARISON:  None. FINDINGS: Moderate  amount of fecal residue within large bowel. No small bowel dilatation or obstruction. There is no free air. There is no evidence of free air. No radio-opaque calculi or other significant radiographic abnormality is seen. Small bone island of the left femoral head. No suspicious osseous abnormality. IMPRESSION: Moderate colonic stool burden otherwise negative exam. Electronically Signed   By: Tollie Eth M.D.   On: 10/16/2016 18:57    Review of Systems  Constitutional: Positive for fever (101 this morning Orally ).  Gastrointestinal: Positive for constipation (Hard stools, difficult to pass ) and nausea. Negative  for blood in stool and vomiting.  Genitourinary: Positive for dysuria. Negative for frequency and urgency.   Physical Exam   Blood pressure 118/63, pulse 64, temperature 98.3 F (36.8 C), temperature source Oral, resp. rate 18, weight 197 lb 12.8 oz (89.7 kg), last menstrual period 10/06/2016, SpO2 98 %, unknown if currently breastfeeding.  Physical Exam  Constitutional: She is oriented to person, place, and time. She appears well-developed and well-nourished.  Non-toxic appearance. She does not have a sickly appearance. She does not appear ill. No distress.  Respiratory: Effort normal.  Genitourinary:  Genitourinary Comments: Vagina - Small amount of white vaginal discharge, no odor  Cervix - No contact bleeding, no active bleeding  Bimanual exam: Cervix closed Uterus non tender, normal size Adnexa non tender, no masses bilaterally, suprapubic tenderness  GC/Chlam, wet prep done Chaperone present for exam.   Musculoskeletal: Normal range of motion.  Neurological: She is alert and oriented to person, place, and time.  Skin: Skin is warm. She is not diaphoretic.  Psychiatric: Her behavior is normal.    MAU Course  Procedures  None  MDM  CBC; WBC normal  Patient afebrile here without antipyretic. Patient continues to rate her pain 8/10 following Toradol 60 mg IM. Will  proceed with pelvic US and Abdominal xray Fleet's enema given here. Patient currently in the bathroom. Thressa ShellerHeather Hogan CNM aware of patient and resumes care.   Assessment and Plan   A:  1. Constipation   2. RLQ abdominal pain   3. Sudden onset of severe abdominal pain   4. Lower abdominal pain     P:  Discharge home in stable condition  Discussed at home management of constipation Follow up with PCP If symptoms worsen patient [facility]   [place] I Burnice Vassel, NP 10/16/2016 8:19 PM

## 2016-10-16 NOTE — Discharge Instructions (Signed)
Constipation, Adult °Constipation is when a person: °· Poops (has a bowel movement) fewer times in a week than normal. °· Has a hard time pooping. °· Has poop that is dry, hard, or bigger than normal. ° °Follow these instructions at home: °Eating and drinking ° °· Eat foods that have a lot of fiber, such as: °? Fresh fruits and vegetables. °? Whole grains. °? Beans. °· Eat less of foods that are high in fat, low in fiber, or overly processed, such as: °? French fries. °? Hamburgers. °? Cookies. °? Candy. °? Soda. °· Drink enough fluid to keep your pee (urine) clear or pale yellow. °General instructions °· Exercise regularly or as told by your doctor. °· Go to the restroom when you feel like you need to poop. Do not hold it in. °· Take over-the-counter and prescription medicines only as told by your doctor. These include any fiber supplements. °· Do pelvic floor retraining exercises, such as: °? Doing deep breathing while relaxing your lower belly (abdomen). °? Relaxing your pelvic floor while pooping. °· Watch your condition for any changes. °· Keep all follow-up visits as told by your doctor. This is important. °Contact a doctor if: °· You have pain that gets worse. °· You have a fever. °· You have not pooped for 4 days. °· You throw up (vomit). °· You are not hungry. °· You lose weight. °· You are bleeding from the anus. °· You have thin, pencil-like poop (stool). °Get help right away if: °· You have a fever, and your symptoms suddenly get worse. °· You leak poop or have blood in your poop. °· Your belly feels hard or bigger than normal (is bloated). °· You have very bad belly pain. °· You feel dizzy or you faint. °This information is not intended to replace advice given to you by your health care provider. Make sure you discuss any questions you have with your health care provider. °Document Released: 04/15/2008 Document Revised: 05/17/2016 Document Reviewed: 04/17/2016 °Elsevier Interactive Patient Education ©  2017 [employer] ° °

## 2016-10-16 NOTE — MAU Note (Signed)
Pain in RLQ started 2 days, now on rt side of abd and rt lower back. Pain has gotten worse.  Worse when she walks.  Fever was 101 this morning. Has been nauseated, feels constipated-but had BM this morning- which was painful. Pain in RLQ with urination

## 2016-10-18 LAB — GC/CHLAMYDIA PROBE AMP (~~LOC~~) NOT AT ARMC
CHLAMYDIA, DNA PROBE: NEGATIVE
Neisseria Gonorrhea: NEGATIVE

## 2017-01-05 ENCOUNTER — Inpatient Hospital Stay (HOSPITAL_COMMUNITY): Payer: Self-pay

## 2017-01-05 ENCOUNTER — Encounter (HOSPITAL_COMMUNITY): Payer: Self-pay | Admitting: *Deleted

## 2017-01-05 ENCOUNTER — Inpatient Hospital Stay (HOSPITAL_COMMUNITY)
Admission: AD | Admit: 2017-01-05 | Discharge: 2017-01-05 | Disposition: A | Discharge status: Home / Self Care | Payer: Medicaid Other | Source: Ambulatory Visit | Attending: Obstetrics and Gynecology | Admitting: Obstetrics and Gynecology

## 2017-01-05 DIAGNOSIS — R1031 Right lower quadrant pain: Secondary | ICD-10-CM | POA: Diagnosis not present

## 2017-01-05 DIAGNOSIS — R509 Fever, unspecified: Secondary | ICD-10-CM | POA: Insufficient documentation

## 2017-01-05 DIAGNOSIS — K5900 Constipation, unspecified: Secondary | ICD-10-CM | POA: Insufficient documentation

## 2017-01-05 DIAGNOSIS — N189 Chronic kidney disease, unspecified: Secondary | ICD-10-CM | POA: Insufficient documentation

## 2017-01-05 DIAGNOSIS — R110 Nausea: Secondary | ICD-10-CM | POA: Insufficient documentation

## 2017-01-05 DIAGNOSIS — Z8742 Personal history of other diseases of the female genital tract: Secondary | ICD-10-CM | POA: Diagnosis not present

## 2017-01-05 LAB — CBC WITH DIFFERENTIAL/PLATELET
Basophils Absolute: 0 10*3/uL (ref 0.0–0.1)
Basophils Relative: 0 %
EOS PCT: 2 %
Eosinophils Absolute: 0.2 10*3/uL (ref 0.0–0.7)
HCT: 34.6 % — ABNORMAL LOW (ref 36.0–46.0)
Hemoglobin: 11.9 g/dL — ABNORMAL LOW (ref 12.0–15.0)
LYMPHS ABS: 3 10*3/uL (ref 0.7–4.0)
LYMPHS PCT: 32 %
MCH: 29.6 pg (ref 26.0–34.0)
MCHC: 34.4 g/dL (ref 30.0–36.0)
MCV: 86.1 fL (ref 78.0–100.0)
MONO ABS: 0.3 10*3/uL (ref 0.1–1.0)
MONOS PCT: 3 %
Neutro Abs: 5.7 10*3/uL (ref 1.7–7.7)
Neutrophils Relative %: 63 %
PLATELETS: 314 10*3/uL (ref 150–400)
RBC: 4.02 MIL/uL (ref 3.87–5.11)
RDW: 13.7 % (ref 11.5–15.5)
WBC: 9.2 10*3/uL (ref 4.0–10.5)

## 2017-01-05 LAB — WET PREP, GENITAL
Clue Cells Wet Prep HPF POC: NONE SEEN
Sperm: NONE SEEN
Trich, Wet Prep: NONE SEEN
Yeast Wet Prep HPF POC: NONE SEEN

## 2017-01-05 LAB — URINALYSIS, ROUTINE W REFLEX MICROSCOPIC
BILIRUBIN URINE: NEGATIVE
GLUCOSE, UA: NEGATIVE mg/dL
HGB URINE DIPSTICK: NEGATIVE
Ketones, ur: NEGATIVE mg/dL
Leukocytes, UA: NEGATIVE
Nitrite: NEGATIVE
PH: 7 (ref 5.0–8.0)
Protein, ur: NEGATIVE mg/dL
SPECIFIC GRAVITY, URINE: 1.001 — AB (ref 1.005–1.030)

## 2017-01-05 LAB — POCT PREGNANCY, URINE: Preg Test, Ur: NEGATIVE

## 2017-01-05 MED ORDER — CYCLOBENZAPRINE HCL 10 MG PO TABS
10.0000 mg | ORAL_TABLET | Freq: Three times a day (TID) | ORAL | Status: DC | PRN
  Filled 2017-01-05: qty 1

## 2017-01-05 MED ORDER — CYCLOBENZAPRINE HCL 10 MG PO TABS
10.0000 mg | ORAL_TABLET | Freq: Once | ORAL | Status: AC
  Administered 2017-01-05: 10 mg via ORAL

## 2017-01-05 MED ORDER — KETOROLAC TROMETHAMINE 10 MG PO TABS
10.0000 mg | ORAL_TABLET | Freq: Once | ORAL | Status: AC
  Administered 2017-01-05: 10 mg via ORAL
  Filled 2017-01-05: qty 1

## 2017-01-05 MED ORDER — CYCLOBENZAPRINE HCL 10 MG PO TABS: 10.0000 mg | ORAL_TABLET | Freq: Three times a day (TID) | ORAL | 0 refills | Status: DC | PRN

## 2017-01-05 MED ORDER — IBUPROFEN 600 MG PO TABS: 600.0000 mg | ORAL_TABLET | Freq: Four times a day (QID) | ORAL | 1 refills | Status: DC | PRN

## 2017-01-05 MED ORDER — IOPAMIDOL (ISOVUE-300) INJECTION 61%: 30.0000 mL | INTRAVENOUS | Status: AC

## 2017-01-05 MED ORDER — HYDROMORPHONE HCL 1 MG/ML IJ SOLN
1.0000 mg | INTRAMUSCULAR | Status: DC | PRN
  Administered 2017-01-05: 1 mg via INTRAVENOUS
  Filled 2017-01-05: qty 1

## 2017-01-05 MED ORDER — HYDROMORPHONE HCL 1 MG/ML IJ SOLN
1.0000 mg | INTRAMUSCULAR | Status: DC | PRN
  Administered 2017-01-05: 1 mg via INTRAMUSCULAR
  Filled 2017-01-05: qty 1

## 2017-01-05 MED ORDER — IOPAMIDOL (ISOVUE-300) INJECTION 61%
100.0000 mL | Freq: Once | INTRAVENOUS | Status: AC | PRN
  Administered 2017-01-05: 100 mL via INTRAVENOUS

## 2017-01-05 MED ORDER — ONDANSETRON HCL 4 MG/2ML IJ SOLN
4.0000 mg | Freq: Once | INTRAMUSCULAR | Status: AC
  Administered 2017-01-05: 4 mg via INTRAVENOUS
  Filled 2017-01-05: qty 2

## 2017-01-05 NOTE — Discharge Instructions (Signed)

## 2017-01-05 NOTE — MAU Note (Signed)
Pt presents to MAU with complaints of pain in the lower right part of her abdomen for two weeks. Pt denies any vaginal bleeding or abnormal discharge

## 2017-01-05 NOTE — MAU Provider Note (Signed)
Chief Complaint: Abdominal Pain   First Provider Initiated Contact with Patient 01/05/17 1145     SUBJECTIVE HPI: Teresa Beck is a 31 y.o. [id number] female who presents to Maternity Admissions reporting severe RLQ pain that started 2 weeks ago and has worsened.   Location: PLQ Quality: sharp Severity: 10/10 on pain scale Duration: 2 weeks Course: worsening Radiation: No right low back Timing: constant Modifying factors: no Improvement w/ Motrin. No relationship to voiding, eating, BMs Associated signs and symptoms: Pos for fever 100.4, nausea, decreased appetite, constipation (but having normal BMs-last one yesterday). Neg for chills, urinary complaints, diarrhea, vaginal bleeding or vaginal discharge.   Past Medical History:  Diagnosis Date  . Chronic kidney disease    stones and infection  . Complication of anesthesia   . Ovarian cyst    OB History  Gravida Para Term Preterm AB Living  3 3 1 2  0 2  SAB TAB Ectopic Multiple Live Births  0 0 0 0 3    # Outcome Date GA Lbr Len/2nd Weight Sex Delivery Anes PTL Lv  3 Preterm 10/29/15 [redacted]w[redacted]d 04:00 / 00:12 1 lb 0.2 oz (0.46 kg) F Vag-Spont None  ND  2 Term 05/23/07 [redacted]w[redacted]d   M Vag-Spont   LIV  1 Preterm 09/25/05 [redacted]w[redacted]d   M Vag-Spont   LIV     Past Surgical History:  Procedure Laterality Date  . NO PAST SURGERIES     Social History   Social History  . Marital status: Single    Spouse name: N/A  . Number of children: N/A  . Years of education: N/A   Occupational History  . Not on file.   Social History Main Topics  . Smoking status: Never Smoker  . Smokeless tobacco: Never Used  . Alcohol use Yes     Comment: not while pregnant  . Drug use: No  . Sexual activity: Yes    Birth control/ protection: None   Other Topics Concern  . Not on file   Social History Narrative  . No narrative on file   History reviewed. No pertinent family history. No current facility-administered medications on file prior to  encounter.    No current outpatient prescriptions on file prior to encounter.   No Known Allergies  I have reviewed patient's Past Medical Hx, Surgical Hx, Family Hx, Social Hx, medications and allergies.   Review of Systems  Constitutional: Positive for appetite change and fever. Negative for chills.  HENT: Negative for congestion.   Respiratory: Negative for cough.   Gastrointestinal: Positive for abdominal pain, constipation and nausea. Negative for abdominal distention, blood in stool, diarrhea and vomiting.  Genitourinary: Negative for dysuria, flank pain, frequency, hematuria, menstrual problem, pelvic pain, urgency, vaginal bleeding and vaginal discharge.  Musculoskeletal: Positive for back pain.  Neurological: Negative for headaches.    OBJECTIVE Patient Vitals for the past 24 hrs:  BP Temp Pulse Resp Height Weight  01/05/17 1111 (!) 124/51 97.9 F (36.6 C) 67 18 - -  01/05/17 1110 - 97.6 F (36.4 C) - 18 5\' 1"  (1.549 m) 204 lb (92.5 kg)   Constitutional: Well-developed, well-nourished female in moderate distress, occasionally tearful.  Cardiovascular: normal rate Respiratory: normal rate and effort.  GI: Abd soft, moderate TTP, equivocal rebound tenderness. Pos involuntary guarding. Pos BS x 4 Neurologic: Alert and oriented x 4.  GU: Neg CVAT.  PELVIC EXAM: NEFG, physiologic discharge, no blood noted, cervix closed; uterus normal size, pos right adnexal tenderness, no mass.  No left adnexal tenderness or mass. No CMT.  LAB RESULTS Results for orders placed or performed during the hospital encounter of 01/05/17 (from the past 24 hour(s))  Urinalysis, Routine w reflex microscopic     Status: Abnormal   Collection Time: 01/05/17 11:00 AM  Result Value Ref Range   Color, Urine COLORLESS (A) YELLOW   APPearance CLEAR CLEAR   Specific Gravity, Urine 1.001 (L) 1.005 - 1.030   pH 7.0 5.0 - 8.0   Glucose, UA NEGATIVE NEGATIVE mg/dL   Hgb urine dipstick NEGATIVE NEGATIVE    Bilirubin Urine NEGATIVE NEGATIVE   Ketones, ur NEGATIVE NEGATIVE mg/dL   Protein, ur NEGATIVE NEGATIVE mg/dL   Nitrite NEGATIVE NEGATIVE   Leukocytes, UA NEGATIVE NEGATIVE  Pregnancy, urine POC     Status: None   Collection Time: 01/05/17 11:18 AM  Result Value Ref Range   Preg Test, Ur NEGATIVE NEGATIVE  CBC with Differential/Platelet     Status: Abnormal   Collection Time: 01/05/17 11:36 AM  Result Value Ref Range   WBC 9.2 4.0 - 10.5 K/uL   RBC 4.02 3.87 - 5.11 MIL/uL   Hemoglobin 11.9 (L) 12.0 - 15.0 g/dL   HCT 40.9 (L) 81.1 - 91.4 %   MCV 86.1 78.0 - 100.0 fL   MCH 29.6 26.0 - 34.0 pg   MCHC 34.4 30.0 - 36.0 g/dL   RDW 78.2 95.6 - 21.3 %   Platelets 314 150 - 400 K/uL   Neutrophils Relative % 63 %   Neutro Abs 5.7 1.7 - 7.7 K/uL   Lymphocytes Relative 32 %   Lymphs Abs 3.0 0.7 - 4.0 K/uL   Monocytes Relative 3 %   Monocytes Absolute 0.3 0.1 - 1.0 K/uL   Eosinophils Relative 2 %   Eosinophils Absolute 0.2 0.0 - 0.7 K/uL   Basophils Relative 0 %   Basophils Absolute 0.0 0.0 - 0.1 K/uL  Wet prep, genital     Status: Abnormal   Collection Time: 01/05/17 11:55 AM  Result Value Ref Range   Yeast Wet Prep HPF POC NONE SEEN NONE SEEN   Trich, Wet Prep NONE SEEN NONE SEEN   Clue Cells Wet Prep HPF POC NONE SEEN NONE SEEN   WBC, Wet Prep HPF POC FEW (A) NONE SEEN   Sperm NONE SEEN     IMAGING US Transvaginal Non-ob  Result Date: 01/05/2017 CLINICAL DATA:  Abdominal pain, acute, right lower quadrant R10.31 (ICD-10-CM) History of ovarian cyst Z87.42 (ICD-10-CM) Fever R50.9 (ICD-10-CM) EXAM: ULTRASOUND PELVIS TRANSVAGINAL TECHNIQUE: Transvaginal ultrasound examination of the pelvis was performed including evaluation of the uterus, ovaries, adnexal regions, and pelvic cul-de-sac. COMPARISON:  10/16/2016 FINDINGS: Uterus Measurements: 7.7 x 4.8 x 6.3 cm. No fibroids or other mass visualized. Endometrium Thickness: 11 mm.  No focal abnormality visualized. Right ovary Measurements:  3.0 x 2.0 x 1.9 cm. Normal appearance/no adnexal mass. Left ovary Measurements: 2.9 x 1.8 x 1.5 cm. Normal appearance/no adnexal mass. Other findings:  No abnormal free fluid IMPRESSION: Normal transvaginal pelvic ultrasound. Electronically Signed   By: Amie Portland M.D.   On: 01/05/2017 13:16   Ct Abdomen Pelvis W Contrast  Result Date: 01/05/2017 CLINICAL DATA:  Right lower quadrant pain for 2 weeks EXAM: CT ABDOMEN AND PELVIS WITH CONTRAST TECHNIQUE: Multidetector CT imaging of the abdomen and pelvis was performed using the standard protocol following bolus administration of intravenous contrast. CONTRAST:  ISOVUE-300 IOPAMIDOL (ISOVUE-300) INJECTION 61% COMPARISON:  None. FINDINGS: Lower chest: Subsegmental atelectasis noted  in the right lower lobe. No pleural or pericardial effusion. Hepatobiliary: There is no focal liver abnormality. The gallbladder appears normal. No biliary dilatation. Pancreas: Unremarkable. No pancreatic ductal dilatation or surrounding inflammatory changes. Spleen: Normal in size without focal abnormality. Adrenals/Urinary Tract: The adrenal glands appear normal. Normal appearance of the right kidney. There is a small cyst identified within the left kidney which measures 1.6 cm. No mass or hydronephrosis identified. The urinary bladder appears normal. Stomach/Bowel: The stomach is unremarkable. The small bowel loops have a normal course and caliber. The appendix is visualized and is normal. Normal appearance of the colon. Vascular/Lymphatic: Normal appearance of the abdominal aorta. No enlarged retroperitoneal or mesenteric adenopathy. No enlarged pelvic or inguinal lymph nodes. Reproductive: Uterus and adnexal structures have a normal physiologic appearance. No mass identified. Other: Trace free fluid identified within the dependent portion of the pelvis. Musculoskeletal: No acute or significant osseous findings. IMPRESSION: 1. No acute findings within the abdomen or the  pelvis. 2. Left kidney cyst Electronically Signed   By: Signa Kellaylor  Stroud M.D.   On: 01/05/2017 16:48    MAU COURSE Orders Placed This Encounter  Procedures  . Wet prep, genital  . US Transvaginal Non-OB  . CT ABDOMEN PELVIS W CONTRAST  . Urinalysis, Routine w reflex microscopic  . CBC with Differential/Platelet  . Pregnancy, urine POC  . Insert peripheral IV  . Discharge patient   Meds ordered this encounter  Medications  . DISCONTD: ibuprofen (ADVIL,MOTRIN) 200 MG tablet    Sig: Take 400 mg by mouth every 6 (six) hours as needed for headache, mild pain or moderate pain.  Marland Kitchen. DISCONTD: HYDROmorphone (DILAUDID) injection 1 mg  . iopamidol (ISOVUE-300) 61 % injection 30 mL  . HYDROmorphone (DILAUDID) injection 1 mg  . ondansetron (ZOFRAN) injection 4 mg  . iopamidol (ISOVUE-300) 61 % injection 100 mL  . ketorolac (TORADOL) tablet 10 mg  . DISCONTD: cyclobenzaprine (FLEXERIL) tablet 10 mg  . cyclobenzaprine (FLEXERIL) 10 MG tablet    Sig: Take 1 tablet (10 mg total) by mouth 3 (three) times daily as needed for muscle spasms.    Dispense:  30 tablet    Refill:  0    Order Specific Question:   Supervising Provider    Answer:   Tilda BurrowFERGUSON, JOHN V [[id number]]  . ibuprofen (ADVIL,MOTRIN) 600 MG tablet    Sig: Take 1 tablet (600 mg total) by mouth every 6 (six) hours as needed for headache, mild pain, moderate pain or cramping.    Dispense:  30 tablet    Refill:  1    Order Specific Question:   Supervising Provider    Answer:   Tilda BurrowFERGUSON, JOHN V [[id number]]  . cyclobenzaprine (FLEXERIL) tablet 10 mg   Discussed Hx, labs, exam, Nml US w/ Dr. Emelda FearFerguson. New orders: CT abd/pelvic ordered to eval for appendicitis or alternative cause for pain, fever.    MDM - RLQ pain and fever (at home--none in MAU) of unknown etiology. No evidence of appendicitis or other emergency intra-abdominal or Gyn condition. Pt still reporting significant pain, but is now only mildly uncomfortable-appearing. She is afebrile,  non-toxic-appearing, tolerating POs. Offered further eval at ED if she feels that is is necessary, but she is reassured by Nml tests/imaging in MAU. Referred to [facility] if no improvement in 203 days or [place] in emergencies.   ASSESSMENT 1. RLQ abdominal pain   2. Fever   3. History of ovarian cyst   4. Abdominal pain, acute,  right lower quadrant     PLAN Discharge home in stable condition. Abd pain precautions Follow-up Information    [facility] Follow up.   Why:  in pain does not improve in 2-3 days Contact information: [address] [place] [address] [phone]       [facility] EMERGENCY DEPARTMENT Follow up.   Specialty:  Emergency Medicine Why:  as needed in emergencies Contact information: [address] [id number] [address] [address] [phone]         Allergies as of 01/05/2017   No Known Allergies     Medication List    TAKE these medications   cyclobenzaprine 10 MG tablet Commonly known as:  FLEXERIL Take 1 tablet (10 mg total) by mouth 3 (three) times daily as needed for muscle spasms.   ibuprofen 600 MG tablet Commonly known as:  ADVIL,MOTRIN Take 1 tablet (600 mg total) by mouth every 6 (six) hours as needed for headache, mild pain, moderate pain or cramping. What changed:  medication strength  how much to take  reasons to take this        [place], CNM 01/05/2017  6:28 PM

## 2017-01-06 LAB — GC/CHLAMYDIA PROBE AMP (~~LOC~~) NOT AT ARMC
Chlamydia: NEGATIVE
Neisseria Gonorrhea: NEGATIVE

## 2017-01-29 ENCOUNTER — Encounter (HOSPITAL_COMMUNITY): Payer: Self-pay

## 2017-01-29 ENCOUNTER — Emergency Department (HOSPITAL_COMMUNITY): Payer: Medicaid Other

## 2017-01-29 ENCOUNTER — Emergency Department (HOSPITAL_COMMUNITY)
Admission: EM | Admit: 2017-01-29 | Discharge: 2017-01-29 | Disposition: A | Discharge status: Home / Self Care | Payer: Medicaid Other | Attending: Emergency Medicine | Admitting: Emergency Medicine

## 2017-01-29 DIAGNOSIS — N189 Chronic kidney disease, unspecified: Secondary | ICD-10-CM | POA: Insufficient documentation

## 2017-01-29 DIAGNOSIS — M549 Dorsalgia, unspecified: Secondary | ICD-10-CM

## 2017-01-29 DIAGNOSIS — R0789 Other chest pain: Secondary | ICD-10-CM | POA: Insufficient documentation

## 2017-01-29 LAB — CBC
HEMATOCRIT: 38 % (ref 36.0–46.0)
HEMOGLOBIN: 12.9 g/dL (ref 12.0–15.0)
MCH: 28.7 pg (ref 26.0–34.0)
MCHC: 33.9 g/dL (ref 30.0–36.0)
MCV: 84.6 fL (ref 78.0–100.0)
Platelets: 324 10*3/uL (ref 150–400)
RBC: 4.49 MIL/uL (ref 3.87–5.11)
RDW: 13.5 % (ref 11.5–15.5)
WBC: 10.9 10*3/uL — ABNORMAL HIGH (ref 4.0–10.5)

## 2017-01-29 LAB — BASIC METABOLIC PANEL
ANION GAP: 9 (ref 5–15)
BUN: 18 mg/dL (ref 6–20)
CALCIUM: 9.2 mg/dL (ref 8.9–10.3)
CHLORIDE: 103 mmol/L (ref 101–111)
CO2: 24 mmol/L (ref 22–32)
Creatinine, Ser: 0.57 mg/dL (ref 0.44–1.00)
GFR calc non Af Amer: >60 mL/min (ref >60)
Glucose, Bld: 107 mg/dL — ABNORMAL HIGH (ref 65–99)
POTASSIUM: 3.9 mmol/L (ref 3.5–5.1)
Sodium: 136 mmol/L (ref 135–145)

## 2017-01-29 LAB — I-STAT TROPONIN, ED: TROPONIN I, POC: 0 ng/mL (ref 0.00–0.08)

## 2017-01-29 MED ORDER — METHOCARBAMOL 500 MG PO TABS: 500.0000 mg | ORAL_TABLET | Freq: Every evening | ORAL | 0 refills | Status: DC | PRN

## 2017-01-29 MED ORDER — IBUPROFEN 800 MG PO TABS: 800.0000 mg | ORAL_TABLET | Freq: Three times a day (TID) | ORAL | 0 refills | Status: DC | PRN

## 2017-01-29 NOTE — ED Notes (Signed)
Unable to collect labs patient is in xray 

## 2017-01-29 NOTE — Discharge Instructions (Signed)
Use ibuprofen for pain and robaxin at bedtime for muscle spasm. Ice or heat and massage. Follow up with your primary care provider. Return to the emergency department if you experience worsening chest pain, shortness of breath, nausea, vomiting or other concerning symptoms.

## 2017-01-29 NOTE — ED Provider Notes (Signed)
[facility]-EMERGENCY DEPT Provider Note   CSN: [id number] Arrival date & time: 01/29/17  1253     History   Chief Complaint Chief Complaint  Patient presents with  . Chest Pain  . Back Pain  . Headache    HPI Adiyah Carmelina PaddockRAMIREZ CORTES is a 31 y.o. female presenting with 1 week of chest pain radiating to her back and neck that has been constant with intermittent episodes of sharp worse pain where she feels like she cannot breathe and has nausea vomiting headache and dizziness. She reports that the pain is worse with exercise. She has seen her PCP 4 days ago and was prescribed indomethacin with a diagnosis of costochondritis. She states that it has not been helping. She denies ever smoking. She reported noticing some leg swelling in the past week and cramping in her right calf when she woke up one time. Denies any swelling now or pain in her calf. Denies history of DVT/PE, prolonged immobilization, recent surgery, estrogen use. She explains that she experiences anxiety at times and have similar symptoms but she was worried that this may be cardiac related.  HPI  Past Medical History:  Diagnosis Date  . Chronic kidney disease    stones and infection  . Complication of anesthesia   . Ovarian cyst     There are no active problems to display for this patient.   Past Surgical History:  Procedure Laterality Date  . NO PAST SURGERIES      OB History    Gravida Para Term Preterm AB Living   3 3 1 2  0 2   SAB TAB Ectopic Multiple Live Births   0 0 0 0 3       Home Medications    Prior to Admission medications   Medication Sig Start Date End Date Taking? Authorizing Provider  albuterol (PROVENTIL HFA;VENTOLIN HFA) 108 (90 Base) MCG/ACT inhaler Inhale 2 puffs into the lungs every 6 (six) hours as needed for wheezing or shortness of breath.   Yes Historical Provider, MD  Calcium Carb-Cholecalciferol (CALCIUM 1000 + D PO) Take 2 tablets by mouth every morning.   Yes Historical Provider, MD   indomethacin (INDOCIN) 25 MG capsule Take 25 mg by mouth 3 (three) times daily with meals.   Yes Historical Provider, MD  Menthol-Methyl Salicylate (ICY HOT EXTRA STRENGTH) 10-30 % CREA Apply 1 application topically at bedtime as needed (pain).   Yes Historical Provider, MD  naproxen sodium (ANAPROX) 220 MG tablet Take 220 mg by mouth 2 (two) times daily as needed (pain).   Yes Historical Provider, MD  ibuprofen (ADVIL,MOTRIN) 800 MG tablet Take 1 tablet (800 mg total) by mouth every 8 (eight) hours as needed. 01/29/17   Georgiana ShoreJessica B Mitchell, PA-C  methocarbamol (ROBAXIN) 500 MG tablet Take 1 tablet (500 mg total) by mouth at bedtime as needed for muscle spasms. 01/29/17   Georgiana ShoreJessica B Mitchell, PA-C    Family History History reviewed. No pertinent family history.  Social History Social History  Substance Use Topics  . Smoking status: Never Smoker  . Smokeless tobacco: Never Used  . Alcohol use Yes     Allergies   Patient has no known allergies.   Review of Systems Review of Systems  Constitutional: Negative for chills and fever.  HENT: Negative for ear pain and sore throat.   Eyes: Negative for pain and visual disturbance.  Respiratory: Positive for shortness of breath. Negative for cough, choking, chest tightness, wheezing and stridor.  Sob when pain comes on or with vigorous exercise  Cardiovascular: Positive for chest pain. Negative for palpitations and leg swelling.  Gastrointestinal: Positive for nausea and vomiting. Negative for abdominal distention, abdominal pain, blood in stool and diarrhea.  Genitourinary: Negative for difficulty urinating, dysuria, flank pain and hematuria.  Musculoskeletal: Positive for neck pain. Negative for arthralgias, back pain and neck stiffness.  Skin: Negative for color change, pallor, rash and wound.  Neurological: Negative for seizures and syncope.     Physical Exam Updated Vital Signs BP 115/72 (BP Location: Left Arm)   Pulse 71    Temp 98.1 F (36.7 C) (Oral)   Resp 18   Ht 5\' 1"  (1.549 m)   Wt 86.2 kg   LMP 01/20/2017   SpO2 98%   BMI 35.90 kg/m   Physical Exam  Constitutional: She appears well-developed and well-nourished. No distress.  Patient is afebrile, nontoxic-appearing, sitting comfortably in bed in no acute distress.  HENT:  Head: Normocephalic and atraumatic.  Mouth/Throat: Oropharynx is clear and moist. No oropharyngeal exudate.  Eyes: Conjunctivae and EOM are normal. Right eye exhibits no discharge. Left eye exhibits no discharge.  Neck: Normal range of motion. Neck supple. No JVD present.  Cardiovascular: Normal rate, regular rhythm, normal heart sounds and intact distal pulses.   No murmur heard. Pulmonary/Chest: Effort normal and breath sounds normal. No respiratory distress. She has no wheezes. She has no rales. She exhibits no tenderness.  Abdominal: Soft. She exhibits no distension. There is no tenderness. There is no guarding.  Musculoskeletal: Normal range of motion. She exhibits no edema, tenderness or deformity.  No leg swelling or tenderness to palpation of her calf.  Lymphadenopathy:    She has no cervical adenopathy.  Neurological: She is alert. No sensory deficit. She exhibits normal muscle tone.  Skin: Skin is warm and dry. No rash noted. She is not diaphoretic. No erythema. No pallor.  Psychiatric: She has a normal mood and affect.  Nursing note and vitals reviewed.    ED Treatments / Results  Labs (all labs ordered are listed, but only abnormal results are displayed) Labs Reviewed  BASIC METABOLIC PANEL - Abnormal; Notable for the following:       Result Value   Glucose, Bld 107 (*)    All other components within normal limits  CBC - Abnormal; Notable for the following:    WBC 10.9 (*)    All other components within normal limits  I-STAT TROPOININ, ED    EKG  EKG Interpretation  Date/Time:  Wednesday January 29 2017 13:02:07 EDT Ventricular Rate:  71 PR  Interval:    QRS Duration: 95 QT Interval:  410 QTC Calculation: 446 R Axis:   77 Text Interpretation:  Sinus rhythm Low voltage, precordial leads Baseline wander in lead(s) V6 No STEMI.  Confirmed by LONG MD, JOSHUA [phone]) on 01/29/2017 4:42:42 PM       Radiology Dg Chest 2 View  Result Date: 01/29/2017 CLINICAL DATA:  Upper back pain and chest pain for 1 week. Shortness of breath and dry cough. Pain with swallowing. EXAM: CHEST  2 VIEW COMPARISON:  None. FINDINGS: The heart size and mediastinal contours are within normal limits. Both lungs are clear. The visualized skeletal structures are unremarkable. IMPRESSION: No active cardiopulmonary disease. Electronically Signed   By: Gaylyn Rong M.D.   On: 01/29/2017 13:57    Procedures Procedures (including critical care time)  Medications Ordered in ED Medications - No data to display  Initial Impression / Assessment and Plan / ED Course  I have reviewed the triage vital signs and the nursing notes.  Pertinent labs & imaging results that were available during my care of the patient were reviewed by me and considered in my medical decision making (see chart for details).     Otherwise healthy 31 year old female presenting with 1 week of chest pain radiating to her back and neck. Exam is unremarkable. She is tender to palpation of costochondral cartilage and trapezius muscle. No leg swelling or tenderness to palpation. PERC negative. Low suspiscion for PE or ACS. Heart score: 1  Patient is well-appearing, non-toxic. Exam and labs unremarkable. Normal EKG and chest x-ray. Negative troponin. Discharge home with symptomatic relief and PCP follow up.  Discussed strict return precautions and advised to return to the emergency department if experiencing any new or worsening symptoms. Instructions were understood and patient agreed with discharge plan. Patient was discussed with Dr. Jacqulyn Bath who agrees with assessment and plan.  Final  Clinical Impressions(s) / ED Diagnoses   Final diagnoses:  Chest wall pain    New Prescriptions New Prescriptions   IBUPROFEN (ADVIL,MOTRIN) 800 MG TABLET    Take 1 tablet (800 mg total) by mouth every 8 (eight) hours as needed.   METHOCARBAMOL (ROBAXIN) 500 MG TABLET    Take 1 tablet (500 mg total) by mouth at bedtime as needed for muscle spasms.     Georgiana Shore, PA-C 01/29/17 1711    Maia Plan, MD 01/30/17 1911

## 2017-01-29 NOTE — ED Triage Notes (Signed)
Pt c/o central and L side chest pain radiating into L neck and upper back, n/v, headache, and dizziness x 1 week.  Pain score 9/10.  Sts blurred vision and light sensitivity.  Pt reports taking naproxen and was prescribed indomethacin w/ no relief.

## 2017-07-17 ENCOUNTER — Encounter: Payer: Self-pay | Admitting: Physician Assistant

## 2017-07-17 ENCOUNTER — Ambulatory Visit (INDEPENDENT_AMBULATORY_CARE_PROVIDER_SITE_OTHER): Payer: Self-pay | Admitting: Physician Assistant

## 2017-07-17 VITALS — BP 112/69 | HR 67 | Temp 99.0°F | Resp 18 | Ht 61.0 in | Wt 197.6 lb

## 2017-07-17 DIAGNOSIS — J029 Acute pharyngitis, unspecified: Secondary | ICD-10-CM

## 2017-07-17 DIAGNOSIS — J069 Acute upper respiratory infection, unspecified: Secondary | ICD-10-CM

## 2017-07-17 LAB — POCT RAPID STREP A (OFFICE): Rapid Strep A Screen: NEGATIVE

## 2017-07-17 MED ORDER — AMOXICILLIN 500 MG PO CAPS: 500.0000 mg | ORAL_CAPSULE | Freq: Two times a day (BID) | ORAL | 0 refills | Status: AC

## 2017-07-17 NOTE — Patient Instructions (Addendum)
I want you to take the amoxicillin as prescribed Please take ibuprofen.  You can take 600mg  every 6 hours.  Take with food.   I would like you to make sure you are drinking 64 o z of water per day.    IF you received an x-ray today, you will receive an invoice from [facility]. Please contact [facility] at [phone] with questions or concerns regarding your invoice.   IF you received labwork today, you will receive an invoice from [facility]. Please contact [facility] at [phone] with questions or concerns regarding your invoice.   Our billing staff will not be able to assist you with questions regarding bills from these companies.  You will be contacted with the lab results as soon as they are available. The fastest way to get your results is to activate your My Chart account. Instructions are located on the last page of this paperwork. If you have not heard from us regarding the results in 2 weeks, please contact this office.

## 2017-07-17 NOTE — Progress Notes (Signed)
PRIMARY CARE AT [facility] [address], [place] [address] [phone]  Date:  07/17/2017   Name:  MAURYA NETHERY   DOB:  [birth year]   MRN:  [id number]  PCP:  System, Provider Not In    History of Present Illness:  Teresa Beck is a 31 y.o. female patient who presents to PCP with  Chief Complaint  Patient presents with  . Sore Throat  . Back Pain    x2 weeks   . Ear Pain  . Headache  . Fever     2 weeks ago, sore throaat, headache, fever and ear pain.  She feels lightheaded, subjective fever and chills.   Some nasal congestion 3 days ago.  No coughing.   She has taken ibuprofen.  Last administration was more than 12 hours ago.  No ear drainage.  No known sick contacts.  She took theraflu which helped a little.   She was told taht she may have albuterol.    There are no active problems to display for this patient.   Past Medical History:  Diagnosis Date  . Chronic kidney disease    stones and infection  . Complication of anesthesia   . Ovarian cyst     Past Surgical History:  Procedure Laterality Date  . NO PAST SURGERIES      Social History  Substance Use Topics  . Smoking status: Never Smoker  . Smokeless tobacco: Never Used  . Alcohol use Yes    No family history on file.  No Known Allergies  Medication list has been reviewed and updated.  Current Outpatient Prescriptions on File Prior to Visit  Medication Sig Dispense Refill  . ibuprofen (ADVIL,MOTRIN) 800 MG tablet Take 1 tablet (800 mg total) by mouth every 8 (eight) hours as needed. 21 tablet 0  . albuterol (PROVENTIL HFA;VENTOLIN HFA) 108 (90 Base) MCG/ACT inhaler Inhale 2 puffs into the lungs every 6 (six) hours as needed for wheezing or shortness of breath.    . Calcium Carb-Cholecalciferol (CALCIUM 1000 + D PO) Take 2 tablets by mouth every morning.    . indomethacin (INDOCIN) 25 MG capsule Take 25 mg by mouth 3 (three) times daily with meals.    . Menthol-Methyl Salicylate (ICY  HOT EXTRA STRENGTH) 10-30 % CREA Apply 1 application topically at bedtime as needed (pain).    . methocarbamol (ROBAXIN) 500 MG tablet Take 1 tablet (500 mg total) by mouth at bedtime as needed for muscle spasms. (Patient not taking: Reported on 07/17/2017) 8 tablet 0  . naproxen sodium (ANAPROX) 220 MG tablet Take 220 mg by mouth 2 (two) times daily as needed (pain).     No current facility-administered medications on file prior to visit.     ROS ROS otherwise unremarkable unless listed above.  Physical Examination: BP 112/69   Pulse 67   Temp 99 F (37.2 C) (Oral)   Resp 18   Ht  (1.549 m)   Wt 197 lb 9.6 oz (89.6 kg)   LMP 07/17/2017   SpO2 96%   BMI 37.34 kg/m  Ideal Body Weight: Weight in (lb) to have BMI = 25: 132  Physical Exam  Constitutional: She is oriented to person, place, and time. She appears well-developed and well-nourished. No distress.  HENT:  Head: Normocephalic and atraumatic.  Right Ear: Tympanic membrane, external ear and ear canal normal.  Left Ear: Tympanic membrane, external ear and ear canal normal.  Nose: Mucosal edema and rhinorrhea present. Right sinus  exhibits no maxillary sinus tenderness and no frontal sinus tenderness. Left sinus exhibits no maxillary sinus tenderness and no frontal sinus tenderness.  Mouth/Throat: No uvula swelling. Posterior oropharyngeal erythema (mild) present. No oropharyngeal exudate or posterior oropharyngeal edema.  Eyes: Pupils are equal, round, and reactive to light. Conjunctivae and EOM are normal.  Cardiovascular: Normal rate and regular rhythm.  Exam reveals no gallop, no distant heart sounds and no friction rub.   No murmur heard. Pulmonary/Chest: Effort normal. No respiratory distress. She has no decreased breath sounds. She has no wheezes. She has no rhonchi.  Lymphadenopathy:       Head (right side): No submandibular, no tonsillar, no preauricular and no posterior auricular adenopathy present.       Head (left  side): No submandibular, no tonsillar, no preauricular and no posterior auricular adenopathy present.  Neurological: She is alert and oriented to person, place, and time.  Skin: She is not diaphoretic.  Psychiatric: She has a normal mood and affect. Her behavior is normal.    Results for orders placed or performed in visit on 07/17/17  POCT rapid strep A  Result Value Ref Range   Rapid Strep A Screen Negative Negative    Assessment and Plan: Angela CoxCorina RAMIREZ CORTES is a 31 y.o. female who is here today  Will treat for respiratory infection due to longevity.  Advised to hydrate and nsaid for pain and fever.  Warned of possible viral side effect and warrant for return.  Sore throat - Plan: POCT rapid strep A  Acute upper respiratory infection - Plan: amoxicillin (AMOXIL) 500 MG capsule  Trena PlattStephanie English, PA-C [facility] 9/10/20188:58 AM

## 2017-08-04 ENCOUNTER — Ambulatory Visit: Payer: Self-pay | Admitting: Physician Assistant

## 2018-02-18 ENCOUNTER — Encounter: Payer: Self-pay | Admitting: Physician Assistant

## 2019-04-30 ENCOUNTER — Encounter (HOSPITAL_COMMUNITY): Payer: Self-pay | Admitting: Emergency Medicine

## 2019-04-30 ENCOUNTER — Other Ambulatory Visit: Payer: Self-pay

## 2019-04-30 ENCOUNTER — Emergency Department (HOSPITAL_COMMUNITY)
Admission: EM | Admit: 2019-04-30 | Discharge: 2019-04-30 | Disposition: A | Discharge status: Home / Self Care | Payer: Self-pay | Attending: Emergency Medicine | Admitting: Emergency Medicine

## 2019-04-30 ENCOUNTER — Emergency Department (HOSPITAL_COMMUNITY): Payer: Self-pay

## 2019-04-30 DIAGNOSIS — R309 Painful micturition, unspecified: Secondary | ICD-10-CM | POA: Insufficient documentation

## 2019-04-30 DIAGNOSIS — R102 Pelvic and perineal pain: Secondary | ICD-10-CM

## 2019-04-30 DIAGNOSIS — R300 Dysuria: Secondary | ICD-10-CM | POA: Insufficient documentation

## 2019-04-30 DIAGNOSIS — R109 Unspecified abdominal pain: Secondary | ICD-10-CM

## 2019-04-30 DIAGNOSIS — N898 Other specified noninflammatory disorders of vagina: Secondary | ICD-10-CM

## 2019-04-30 LAB — CBC WITH DIFFERENTIAL/PLATELET
Abs Immature Granulocytes: 0.02 10*3/uL (ref 0.00–0.07)
Basophils Absolute: 0 10*3/uL (ref 0.0–0.1)
Basophils Relative: 1 %
Eosinophils Absolute: 0.2 10*3/uL (ref 0.0–0.5)
Eosinophils Relative: 2 %
HCT: 35.8 % — ABNORMAL LOW (ref 36.0–46.0)
Hemoglobin: 11.6 g/dL — ABNORMAL LOW (ref 12.0–15.0)
Immature Granulocytes: 0 %
Lymphocytes Relative: 32 %
Lymphs Abs: 2.6 10*3/uL (ref 0.7–4.0)
MCH: 29 pg (ref 26.0–34.0)
MCHC: 32.4 g/dL (ref 30.0–36.0)
MCV: 89.5 fL (ref 80.0–100.0)
Monocytes Absolute: 0.5 10*3/uL (ref 0.1–1.0)
Monocytes Relative: 6 %
Neutro Abs: 4.7 10*3/uL (ref 1.7–7.7)
Neutrophils Relative %: 59 %
Platelets: 317 10*3/uL (ref 150–400)
RBC: 4 MIL/uL (ref 3.87–5.11)
RDW: 14.3 % (ref 11.5–15.5)
WBC: 7.9 10*3/uL (ref 4.0–10.5)
nRBC: 0 % (ref 0.0–0.2)

## 2019-04-30 LAB — COMPREHENSIVE METABOLIC PANEL
ALT: 23 U/L (ref 0–44)
AST: 14 U/L — ABNORMAL LOW (ref 15–41)
Albumin: 3.7 g/dL (ref 3.5–5.0)
Alkaline Phosphatase: 56 U/L (ref 38–126)
Anion gap: 8 (ref 5–15)
BUN: 9 mg/dL (ref 6–20)
CO2: 21 mmol/L — ABNORMAL LOW (ref 22–32)
Calcium: 8.8 mg/dL — ABNORMAL LOW (ref 8.9–10.3)
Chloride: 108 mmol/L (ref 98–111)
Creatinine, Ser: 0.66 mg/dL (ref 0.44–1.00)
GFR calc Af Amer: >60 mL/min (ref >60)
GFR calc non Af Amer: >60 mL/min (ref >60)
Glucose, Bld: 98 mg/dL (ref 70–99)
Potassium: 3.9 mmol/L (ref 3.5–5.1)
Sodium: 137 mmol/L (ref 135–145)
Total Bilirubin: 0.5 mg/dL (ref 0.3–1.2)
Total Protein: 7 g/dL (ref 6.5–8.1)

## 2019-04-30 LAB — URINALYSIS, ROUTINE W REFLEX MICROSCOPIC
Bilirubin Urine: NEGATIVE
Glucose, UA: NEGATIVE mg/dL
Hgb urine dipstick: NEGATIVE
Ketones, ur: NEGATIVE mg/dL
Leukocytes,Ua: NEGATIVE
Nitrite: NEGATIVE
Protein, ur: NEGATIVE mg/dL
Specific Gravity, Urine: 1.004 — ABNORMAL LOW (ref 1.005–1.030)
pH: 7 (ref 5.0–8.0)

## 2019-04-30 LAB — POC URINE PREG, ED: Preg Test, Ur: NEGATIVE

## 2019-04-30 LAB — WET PREP, GENITAL
Clue Cells Wet Prep HPF POC: NONE SEEN
Sperm: NONE SEEN
Trich, Wet Prep: NONE SEEN
Yeast Wet Prep HPF POC: NONE SEEN

## 2019-04-30 IMAGING — CT CT RENAL STONE PROTOCOL
2 of 4 series · 16 of 46 positions shown, 18 images · non-contrast
Comparison: [DATE]

CLINICAL DATA: Right flank pain

EXAM:
CT ABDOMEN AND PELVIS WITHOUT CONTRAST
TECHNIQUE: Multidetector CT imaging of the abdomen and pelvis was performed
following the standard protocol without IV contrast.

[Series 3: renal stone 5.0 · axial · 0.95mm/px · z∈[+707,+1182]mm · 13 of 105 slices shown, 15 images]
[im 5/105  soft-tissue]
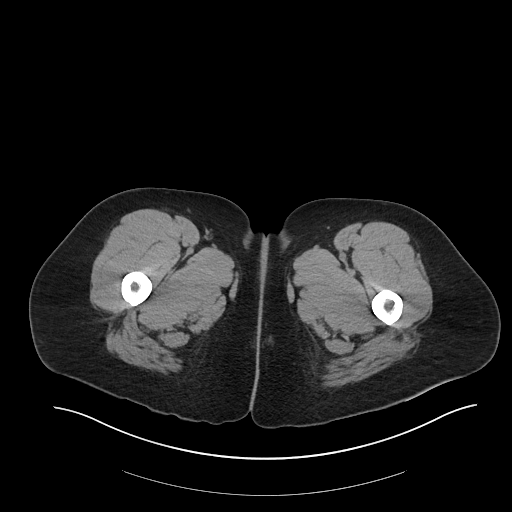
[im 5/105  bone]
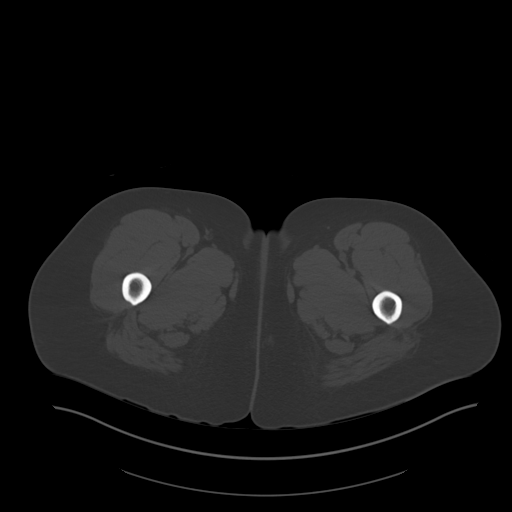
[im 14/105  soft-tissue]
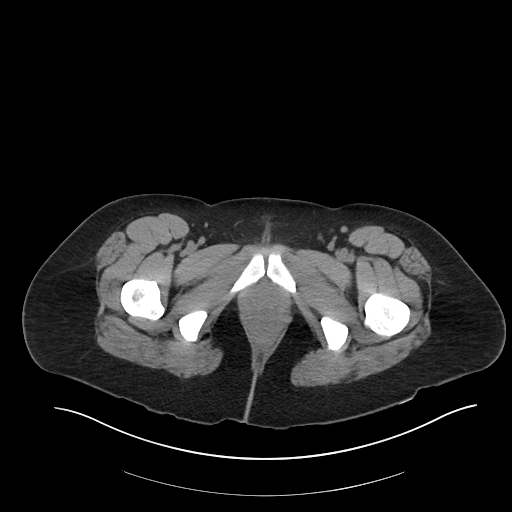
[im 22/105  soft-tissue]
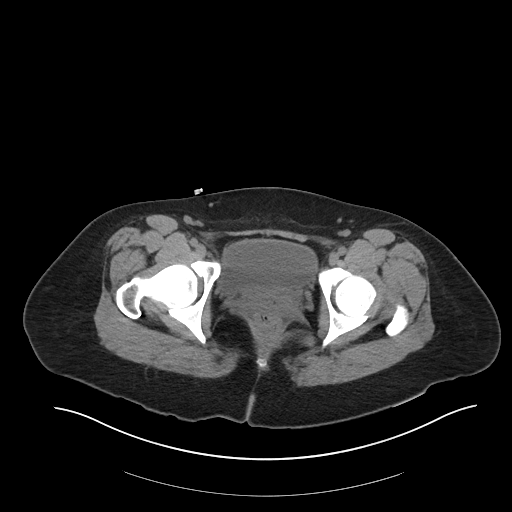
[im 31/105  soft-tissue]
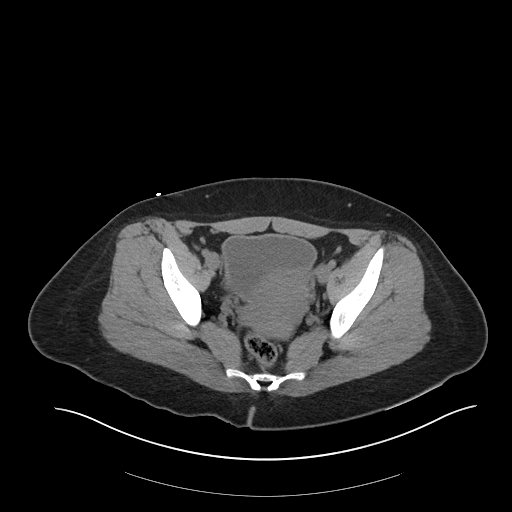
[im 35/105  soft-tissue]
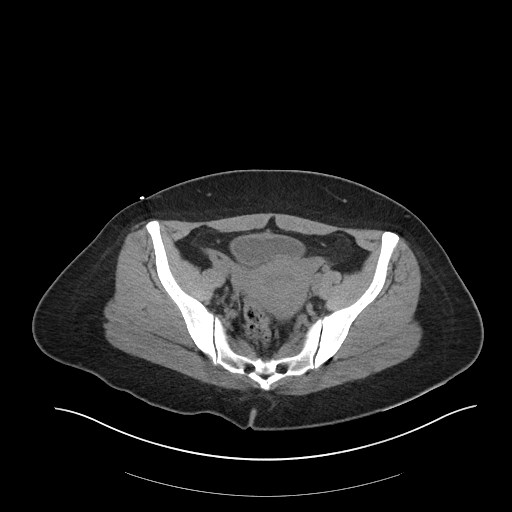
[im 44/105  soft-tissue]
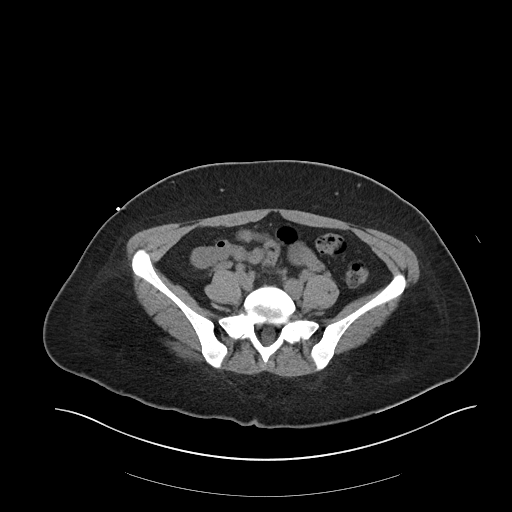
[im 53/105  soft-tissue]
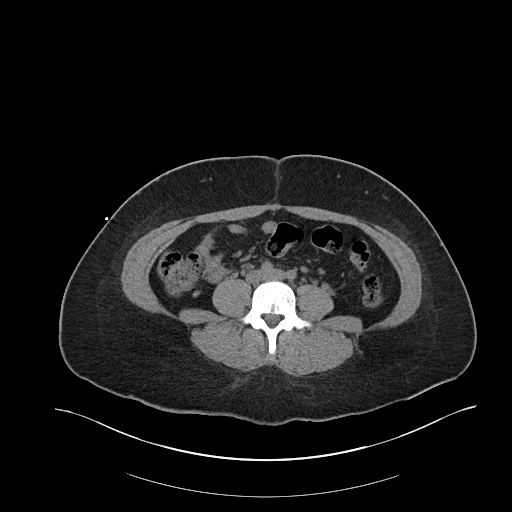
[im 61/105  soft-tissue]
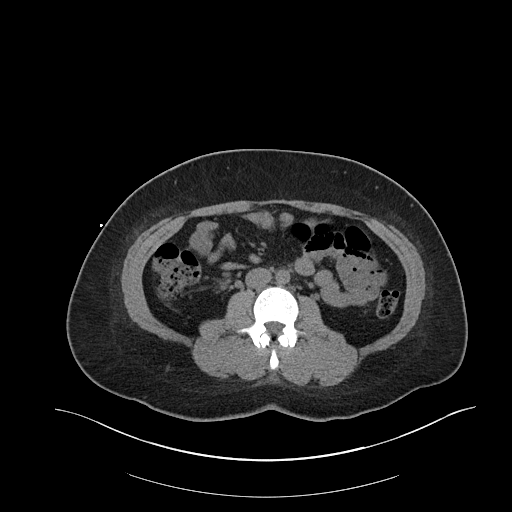
[im 70/105  soft-tissue]
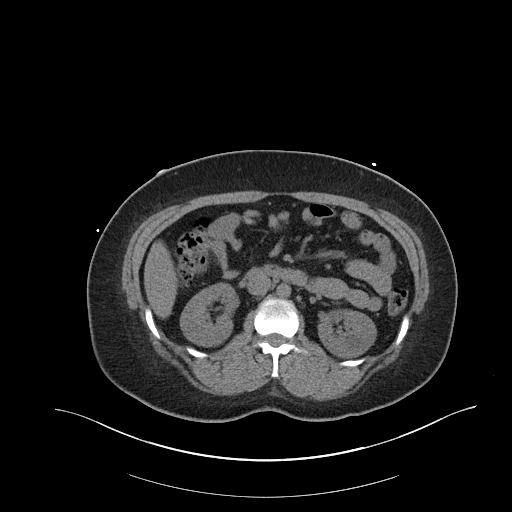
[im 70/105  bone]
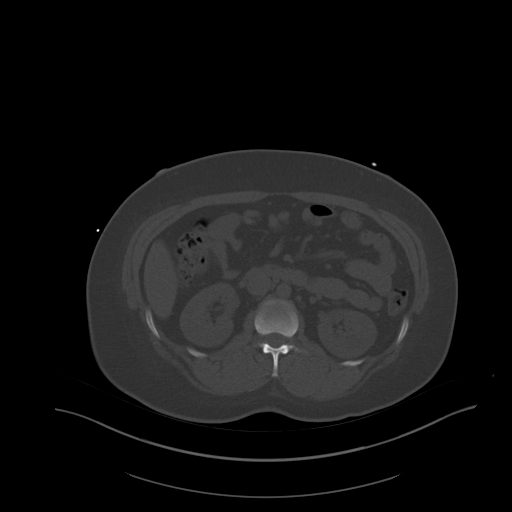
[im 74/105  soft-tissue]
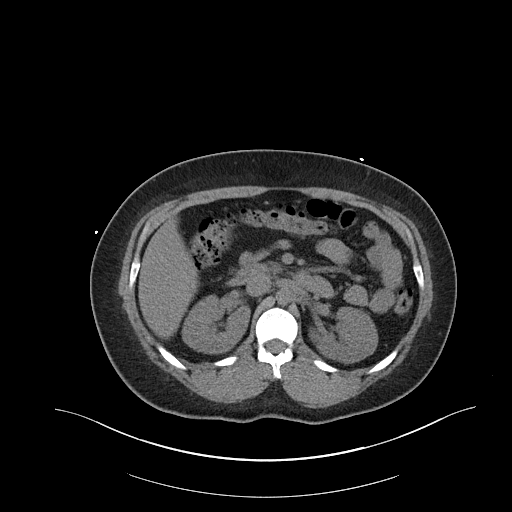
[im 83/105  soft-tissue]
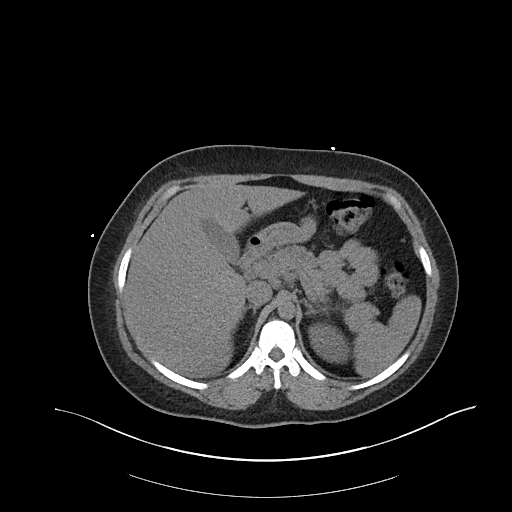
[im 92/105  soft-tissue]
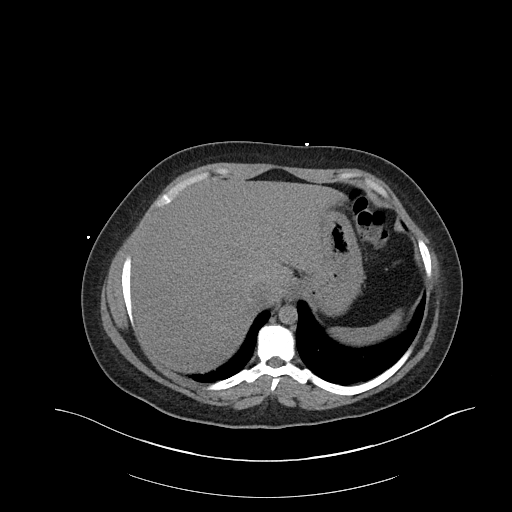
[im 100/105  soft-tissue]
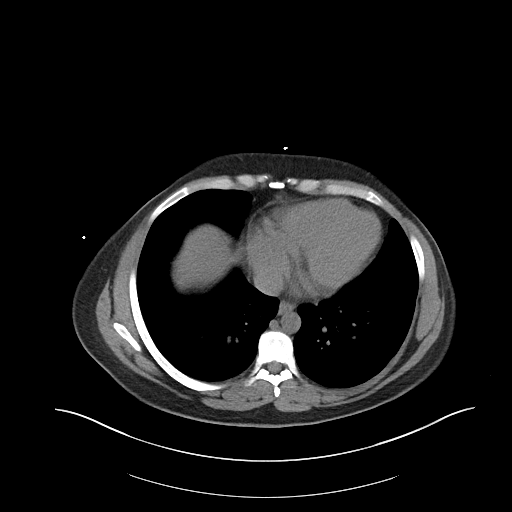

[Series 6: renal stone 3.0 cor · coronal · 0.92mm/px · 3 of 104 slices shown]
[im 35/104  soft-tissue]
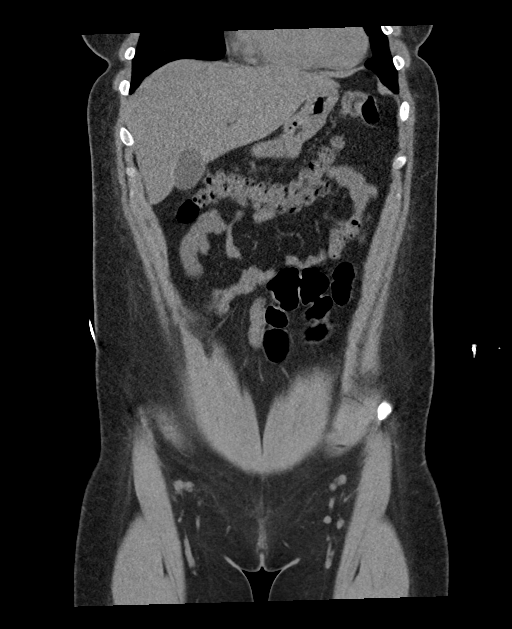
[im 46/104  soft-tissue]
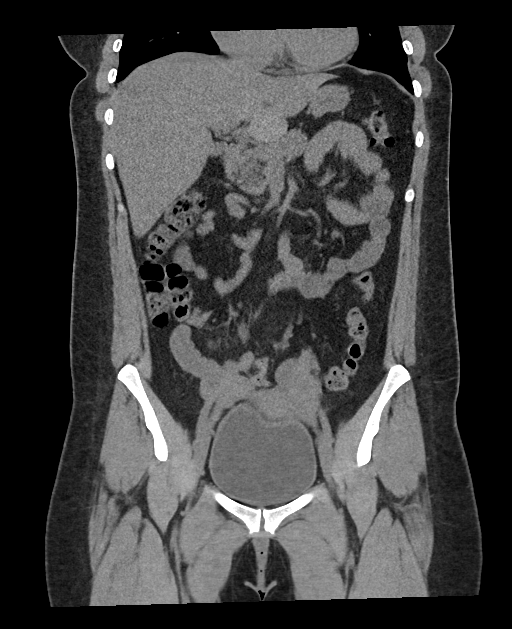
[im 58/104  soft-tissue]
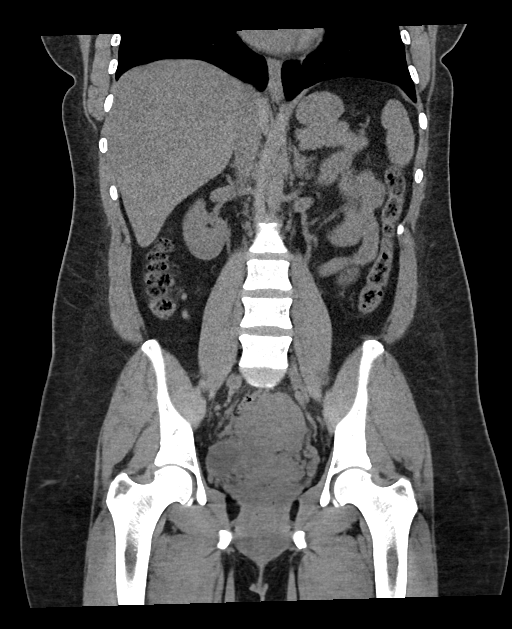

[16 of 46 positions shown; findings below may reference images not displayed]

FINDINGS: Lower chest: No acute abnormality.

Hepatobiliary: No solid liver abnormality is seen. No gallstones,
gallbladder wall thickening, or biliary dilatation.

Pancreas: Unremarkable. No pancreatic ductal dilatation or
surrounding inflammatory changes.

Spleen: Normal in size without significant abnormality.

Adrenals/Urinary Tract: Adrenal glands are unremarkable. Tiny
nonobstructive calculus of the midportion of the right kidney. No
evidence of ureteral calculus or hydronephrosis. Bladder is
unremarkable.

Stomach/Bowel: Stomach is within normal limits. Appendix appears
normal. No evidence of bowel wall thickening, distention, or
inflammatory changes.

Vascular/Lymphatic: No significant vascular findings are present. No
enlarged abdominal or pelvic lymph nodes.

Reproductive: No mass or other significant abnormality.

Other: No abdominal wall hernia or abnormality. No abdominopelvic
ascites.

Musculoskeletal: No acute or significant osseous findings.
IMPRESSION: Tiny nonobstructive calculus of the midportion of the right kidney.
No evidence of ureteral calculus or hydronephrosis. No non-contrast
CT findings to explain right flank pain.

## 2019-04-30 MED ORDER — STERILE WATER FOR INJECTION IJ SOLN
INTRAMUSCULAR | Status: AC
  Administered 2019-04-30: 14:00:00 1 mL
  Filled 2019-04-30: qty 10

## 2019-04-30 MED ORDER — AZITHROMYCIN 250 MG PO TABS
1000.0000 mg | ORAL_TABLET | Freq: Once | ORAL | Status: AC
  Administered 2019-04-30: 1000 mg via ORAL
  Filled 2019-04-30: qty 4

## 2019-04-30 MED ORDER — ONDANSETRON 4 MG PO TBDP
4.0000 mg | ORAL_TABLET | Freq: Once | ORAL | Status: AC
  Administered 2019-04-30: 14:00:00 4 mg via ORAL
  Filled 2019-04-30: qty 1

## 2019-04-30 MED ORDER — KETOROLAC TROMETHAMINE 30 MG/ML IJ SOLN
30.0000 mg | Freq: Once | INTRAMUSCULAR | Status: AC
  Administered 2019-04-30: 30 mg via INTRAVENOUS
  Filled 2019-04-30: qty 1

## 2019-04-30 MED ORDER — SODIUM CHLORIDE 0.9 % IV BOLUS
1000.0000 mL | Freq: Once | INTRAVENOUS | Status: AC
  Administered 2019-04-30: 1000 mL via INTRAVENOUS

## 2019-04-30 MED ORDER — CEFTRIAXONE SODIUM 250 MG IJ SOLR
250.0000 mg | Freq: Once | INTRAMUSCULAR | Status: AC
  Administered 2019-04-30: 14:00:00 250 mg via INTRAMUSCULAR
  Filled 2019-04-30: qty 250

## 2019-04-30 NOTE — ED Triage Notes (Signed)
Pt states she has been having abdominal pain radiates to right flank. Pain has been going on X4 days. Pt states she has a history of kidney stones.   Pain 9/10

## 2019-04-30 NOTE — Discharge Instructions (Addendum)
You have been seen today for abdominal pain, right flank pain, and vaginal discharge. Your STD results are pending. We treated you prophylactically for some STDs. If your results are positive you will receive a call. If your results are positive, please let your partner know. Please read and follow all provided instructions.   1. Medications: usual home medications 2. Treatment: rest, drink plenty of fluids 3. Follow Up: Please follow up with your primary doctor in 2 days for discussion of your diagnoses and further evaluation after today's visit; if you do not have a primary care doctor use the resource guide provided to find one; Please return to the ER for any new or worsening symptoms. Please obtain all of your results from medical records or have your doctors office obtain the results - share them with your doctor - you should be seen at your doctors office. Call today to arrange your follow up.   Take medications as prescribed. Please review all of the medicines and only take them if you do not have an allergy to them. Return to the emergency room for worsening condition or new concerning symptoms. Follow up with your regular doctor. If you don't have a regular doctor use one of the numbers below to establish a primary care doctor.  Please be aware that if you are taking birth control pills, taking other prescriptions, ESPECIALLY ANTIBIOTICS may make the birth control ineffective - if this is the case, either do not engage in sexual activity or use alternative methods of birth control such as condoms until you have finished the medicine and your family doctor says it is OK to restart them. If you are on a blood thinner such as COUMADIN, be aware that any other medicine that you take may cause the coumadin to either work too much, or not enough - you should have your coumadin level rechecked in next 7 days if this is the case.  ?  It is also a possibility that you have an allergic reaction to any of  the medicines that you have been prescribed - Everybody reacts differently to medications and while MOST people have no trouble with most medicines, you may have a reaction such as nausea, vomiting, rash, swelling, shortness of breath. If this is the case, please stop taking the medicine immediately and contact your physician.  ?  You should return to the ER if you develop severe or worsening symptoms.   Emergency Department Resource Guide 1) Find a Doctor and Pay Out of Pocket Although you won't have to find out who is covered by your insurance plan, it is a good idea to ask around and get recommendations. You will then need to call the office and see if the doctor you have chosen will accept you as a new patient and what types of options they offer for patients who are self-pay. Some doctors offer discounts or will set up payment plans for their patients who do not have insurance, but you will need to ask so you aren't surprised when you get to your appointment.  2) Contact Your Local Health Department Not all health departments have doctors that can see patients for sick visits, but many do, so it is worth a call to see if yours does. If you don't know where your local health department is, you can check in your phone book. The CDC also has a tool to help you locate your state's health department, and many state websites also have listings of all  of their local health departments.  3) Find a Walk-in Clinic If your illness is not likely to be very severe or complicated, you may want to try a walk in clinic. These are popping up all over the country in pharmacies, drugstores, and shopping centers. They're usually staffed by nurse practitioners or physician assistants that have been trained to treat common illnesses and complaints. They're usually fairly quick and inexpensive. However, if you have serious medical issues or chronic medical problems, these are probably not your best option.  No Primary  Care Doctor: Call Health Connect at  [phone] - they can help you locate a primary care doctor that  accepts your insurance, provides certain services, etc. Physician Referral Service- [phone]  Chronic Pain Problems: Organization         Address  Phone   Notes  [facility]  [phone] Patients need to be referred by their primary care doctor.   Medication Assistance: Organization         Address  Phone   Notes  [facility] [address] [place], [place] [address] [phone] --Must be a resident of [place] [facility] -- Must have NO insurance coverage whatsoever (no Medicaid/ Medicare, etc.) -- The pt. MUST have a primary care doctor that directs their care regularly and follows them in the community   Med[facility]ist  [phone]   [employer]  [phone]    Agencies that provide inexpensive medical care: Organization         Address  Phone   Notes  [facility]  [phone]   [facility]    [phone]   [facility] [address] [place], [place] [address] [phone]   [facility] [address], [place] [phone]   [facility]    [phone]) [phone]   [facility]    [phone]   [facility]  [address], [place] Phone:  [phone], Fax:  [phone] Hours of Operation:  9 am - 6 pm, M-F.  Also accepts Medicaid/Medicare and self-pay.  [facility]  [address], [place] Phone: [phone], Fax: [phone]. Hours of Operation:  8:30 am - 5:30 pm, M-F.  Also accepts Medicaid and self-pay.  [facility] [place] [address], [place] Phone: [phone]   [facility] [address], [place], [place] [phone], Ext. [phone] Mondays & Thursdays: 7-9 AM.  First 15 patients are seen on a first  come, first serve basis.    Medicaid-accepting [facility] Providers:  Organization         Address  Phone   Notes  [facility] [address], [place] [phone] Also accepts self-pay patients.  [facility] [address], [place]  [phone]) [phone]   [facility] [address], [place] [phone]   [facility] [address] [place] [address], [place] [phone]   Renaye RakersVeita Bland [address], [place]   [phone] Only accepts [place] patients after they have their name applied to their card.   Self-Pay (no insurance) in [facility] [place]:  Organization         Address  Phone   Notes  Sickle Cell Patients,  [facility] [address] [place], [place] [phone]   [facility] [address] [place], [place] [phone]   [facility]  [address], [place] [phone]   [facility]/Dr. Osei-Bonsu  [address], [place] or [address], [place] [phone] Phone number for both [place] and [place] locations is the same.  [facility] [address], [place] [phone]   [facility] [address] [place] or [address] [phone]   [facility] [address], [place] [phone], phone; [phone], fax Sees patients 1st and 3rd Saturday of every month.  Must not qualify for public or private insurance (i.e. Medicaid, Medicare, [place] Health Choice, Veterans' Benefits)  Household income should be no more than 200% of the poverty level The clinic cannot treat you if you are pregnant or think you are pregnant  Sexually transmitted diseases are not treated at the clinic.

## 2019-04-30 NOTE — ED Notes (Signed)
Patient transported to CT 

## 2019-04-30 NOTE — ED Notes (Signed)
Pt verbalized understanding of discharge and follow up instructions. Pt ambulatory with steady gait to lobby. IV removed and bleeding controlled.

## 2019-04-30 NOTE — ED Provider Notes (Signed)
[facility] Provider Note   CSN: [id number] Arrival date & time: 04/30/19  1043    History   Chief Complaint Chief Complaint  Patient presents with   Abdominal Pain    HPI Teresa Beck is a 33 y.o. female with a PMH of UTI, Kidney stones, and ovarian cyst presenting with intermittent suprapubic abdominal pain radiating to the right flank onset 4 days ago. Patient states symptoms are worse with urination. Patient describes pain as a burning sensation. Patient states she has tried tylenol and ibuprofen without relief. Patient also reports white vaginal discharge. Patient states she is sexually active with 1 person and does not use protection. Patient reports she has had Chlamydia and received treatment in the past. Patient is requesting to be tested for STIs. Patient denies frequency or hematuria. Patient denies fever, nausea, vomiting, or diarrhea. Patient states last BM was yesterday and it was normal. Patient reports LMP was 2 weeks ago. Patient denies any abdominal surgeries.      HPI  Past Medical History:  Diagnosis Date   Chronic kidney disease    stones and infection   Complication of anesthesia    Ovarian cyst     There are no active problems to display for this patient.   Past Surgical History:  Procedure Laterality Date   NO PAST SURGERIES       OB History    Gravida  3   Para  3   Term  1   Preterm  2   AB  0   Living  2     SAB  0   TAB  0   Ectopic  0   Multiple  0   Live Births  3            Home Medications    Prior to Admission medications   Medication Sig Start Date End Date Taking? Authorizing Provider  albuterol (PROVENTIL HFA;VENTOLIN HFA) 108 (90 Base) MCG/ACT inhaler Inhale 2 puffs into the lungs every 6 (six) hours as needed for wheezing or shortness of breath.    [provider]  Calcium Carb-Cholecalciferol (CALCIUM 1000 + D PO) Take 2 tablets by mouth every  morning.    [provider]  ibuprofen (ADVIL,MOTRIN) 800 MG tablet Take 1 tablet (800 mg total) by mouth every 8 (eight) hours as needed. 01/29/17   Georgiana ShoreMitchell, Jessica B, PA-C  indomethacin (INDOCIN) 25 MG capsule Take 25 mg by mouth 3 (three) times daily with meals.    [provider]  Menthol-Methyl Salicylate (ICY HOT EXTRA STRENGTH) 10-30 % CREA Apply 1 application topically at bedtime as needed (pain).    [provider]  methocarbamol (ROBAXIN) 500 MG tablet Take 1 tablet (500 mg total) by mouth at bedtime as needed for muscle spasms. Patient not taking: Reported on 07/17/2017 01/29/17   Mathews RobinsonsMitchell, Jessica B, PA-C  naproxen sodium (ANAPROX) 220 MG tablet Take 220 mg by mouth 2 (two) times daily as needed (pain).    [provider]    Family History No family history on file.  Social History Social History   Tobacco Use   Smoking status: Never Smoker   Smokeless tobacco: Never Used  Substance Use Topics   Alcohol use: Yes   Drug use: No     Allergies   Patient has no known allergies.   Review of Systems Review of Systems  Constitutional: Negative for activity change, appetite change, chills, fever and unexpected weight  change.  HENT: Negative for congestion, rhinorrhea and sore throat.   Eyes: Negative for visual disturbance.  Respiratory: Negative for cough and shortness of breath.   Cardiovascular: Negative for chest pain.  Gastrointestinal: Positive for abdominal pain. Negative for constipation, diarrhea, nausea and vomiting.  Endocrine: Negative for polydipsia, polyphagia and polyuria.  Genitourinary: Positive for dysuria, flank pain and vaginal discharge. Negative for frequency, hematuria, menstrual problem, vaginal bleeding and vaginal pain.  Musculoskeletal: Negative for back pain.  Skin: Negative for rash.  Allergic/Immunologic: Negative for immunocompromised state.  Psychiatric/Behavioral: The patient is not nervous/anxious.        Physical Exam Updated Vital Signs BP (!) 101/56 (BP Location: Right Arm)    Pulse (!) 51    Temp 98.1 F (36.7 C)    Resp 16    Ht 5' (1.524 m)    Wt 83 kg    SpO2 98%    BMI 35.74 kg/m   Physical Exam Vitals signs and nursing note reviewed. Exam conducted with a chaperone present.  Constitutional:      General: She is not in acute distress.    Appearance: She is well-developed. She is not diaphoretic.  HENT:     Head: Normocephalic and atraumatic.     Mouth/Throat:     Mouth: Mucous membranes are moist.     Pharynx: No pharyngeal swelling or oropharyngeal exudate.  Eyes:     General: No scleral icterus. Neck:     Musculoskeletal: Normal range of motion and neck supple.  Cardiovascular:     Rate and Rhythm: Normal rate and regular rhythm.     Heart sounds: Normal heart sounds. No murmur. No friction rub. No gallop.   Pulmonary:     Effort: Pulmonary effort is normal. No respiratory distress.     Breath sounds: Normal breath sounds. No wheezing or rales.  Abdominal:     General: Bowel sounds are normal. There is no distension.     Palpations: Abdomen is soft. Abdomen is not rigid. There is no mass.     Tenderness: There is abdominal tenderness (Mild suprapubic tenderness upon palpation. ) in the suprapubic area. There is right CVA tenderness. There is no left CVA tenderness, guarding or rebound. Negative signs include McBurney's sign.     Hernia: No hernia is present.  Genitourinary:    Labia:        Right: No rash or tenderness.        Left: No rash or tenderness.      Vagina: Vaginal discharge (Thin white discharge noted.) present. No erythema, tenderness or bleeding.     Cervix: Discharge (Thin white discharge noted.) present. No cervical motion tenderness, lesion, erythema or cervical bleeding.     Adnexa: Right adnexa normal and left adnexa normal.  Musculoskeletal: Normal range of motion.  Skin:    Findings: No rash.  Neurological:     Mental Status: She is  alert and oriented to person, place, and time.     ED Treatments / Results  Labs (all labs ordered are listed, but only abnormal results are displayed) Labs Reviewed  WET PREP, GENITAL - Abnormal; Notable for the following components:      Result Value   WBC, Wet Prep HPF POC FEW (*)    All other components within normal limits  CBC WITH DIFFERENTIAL/PLATELET - Abnormal; Notable for the following components:   Hemoglobin 11.6 (*)    HCT 35.8 (*)    All other components within normal limits  COMPREHENSIVE METABOLIC PANEL - Abnormal; Notable for the following components:   CO2 21 (*)    Calcium 8.8 (*)    AST 14 (*)    All other components within normal limits  URINALYSIS, ROUTINE W REFLEX MICROSCOPIC - Abnormal; Notable for the following components:   Color, Urine STRAW (*)    Specific Gravity, Urine 1.004 (*)    All other components within normal limits  URINE CULTURE  HIV ANTIBODY (ROUTINE TESTING W REFLEX)  RPR  POC URINE PREG, ED  GC/CHLAMYDIA PROBE AMP ([place]) NOT AT [facility]    EKG None  Radiology Ct Renal Stone Study  Result Date: 04/30/2019 CLINICAL DATA:  Right flank pain EXAM: CT ABDOMEN AND PELVIS WITHOUT CONTRAST TECHNIQUE: Multidetector CT imaging of the abdomen and pelvis was performed following the standard protocol without IV contrast. COMPARISON:  01/05/2017 FINDINGS: Lower chest: No acute abnormality. Hepatobiliary: No solid liver abnormality is seen. No gallstones, gallbladder wall thickening, or biliary dilatation. Pancreas: Unremarkable. No pancreatic ductal dilatation or surrounding inflammatory changes. Spleen: Normal in size without significant abnormality. Adrenals/Urinary Tract: Adrenal glands are unremarkable. Tiny nonobstructive calculus of the midportion of the right kidney. No evidence of ureteral calculus or hydronephrosis. Bladder is unremarkable. Stomach/Bowel: Stomach is within normal limits. Appendix appears normal. No evidence of bowel wall  thickening, distention, or inflammatory changes. Vascular/Lymphatic: No significant vascular findings are present. No enlarged abdominal or pelvic lymph nodes. Reproductive: No mass or other significant abnormality. Other: No abdominal wall hernia or abnormality. No abdominopelvic ascites. Musculoskeletal: No acute or significant osseous findings. IMPRESSION: Tiny nonobstructive calculus of the midportion of the right kidney. No evidence of ureteral calculus or hydronephrosis. No non-contrast CT findings to explain right flank pain. Electronically Signed   By: Lauralyn PrimesAlex  Bibbey M.D.   On: 04/30/2019 13:19    Procedures Procedures (including critical care time)  Medications Ordered in ED Medications  sodium chloride 0.9 % bolus 1,000 mL (0 mLs Intravenous Stopped 04/30/19 1251)  ketorolac (TORADOL) 30 MG/ML injection 30 mg (30 mg Intravenous Given 04/30/19 1155)  cefTRIAXone (ROCEPHIN) injection 250 mg (250 mg Intramuscular Given 04/30/19 1337)  azithromycin (ZITHROMAX) tablet 1,000 mg (1,000 mg Oral Given 04/30/19 1336)  ondansetron (ZOFRAN-ODT) disintegrating tablet 4 mg (4 mg Oral Given 04/30/19 1337)     Initial Impression / Assessment and Plan / ED Course  I have reviewed the triage vital signs and the nursing notes.  Pertinent labs & imaging results that were available during my care of the patient were reviewed by me and considered in my medical decision making (see chart for details).  Clinical Course as of Apr 29 1341  Fri Apr 30, 2019  1225 WBCs are within normal limits. Hemoglobin low at 11.6. This appears to be similar to previous values.   CBC with Differential(!) [AH]  1226 UA is negative for leukocytes, nitrites, or Hgb.   Urinalysis, Routine w reflex microscopic(!) [AH]  1226 Wet prep reveals few WBCs.  WBC, Wet Prep HPF POC(!): FEW [AH]  1338 Tiny nonobstructive calculus of the midportion of the right kidney. No evidence of ureteral calculus or hydronephrosis. No non-contrast CT  findings to explain right flank pain.    CT Renal Soundra PilonStone Study [AH]    Clinical Course User Index [AH] Leretha DykesHernandez, Cathryne Mancebo P, [place]-C      Patient presents with suprapubic abdominal pain, vaginal discharge, and dysuria. WBCs are within normal limits. UA is negative. Wet prep reveals few WBCs. CT stone study reveals a tiny nonobstructive  calculus of the midportion of the right kidney, but no evidence of ureteral calculus or hydronephrosis. Pt presents with concerns for possible STD.  Pt understands that they have GC/Chlamydia cultures pending and that they will need to inform all sexual partners if results return positive. Pt has been treated prophylactically with azithromycin and Rocephin due to pts history, pelvic exam, and wet prep with increased WBCs.  Pt not concerning for PID because hemodynamically stable and no cervical motion tenderness on pelvic exam. Symptoms have improved while in the ER and patient is stable in no acute distress. Patient to be discharged with instructions to follow up with OBGYN/PCP. Discussed importance of using protection when sexually active.   Final Clinical Impressions(s) / ED Diagnoses   Final diagnoses:  Suprapubic abdominal pain  Right flank pain  Vaginal discharge    ED Discharge Orders    None       Julienne Kass 04/30/19 1342    Jola Schmidt, MD 04/30/19 1620

## 2019-05-01 LAB — URINE CULTURE: Culture: NO GROWTH

## 2019-05-01 LAB — HIV ANTIBODY (ROUTINE TESTING W REFLEX): HIV Screen 4th Generation wRfx: NONREACTIVE

## 2019-05-01 LAB — RPR: RPR Ser Ql: NONREACTIVE

## 2019-05-03 LAB — GC/CHLAMYDIA PROBE AMP (~~LOC~~) NOT AT ARMC
Chlamydia: NEGATIVE
Neisseria Gonorrhea: NEGATIVE

## 2020-01-03 ENCOUNTER — Ambulatory Visit: Payer: Self-pay | Attending: Internal Medicine

## 2020-01-03 DIAGNOSIS — Z20822 Contact with and (suspected) exposure to covid-19: Secondary | ICD-10-CM

## 2020-01-04 LAB — NOVEL CORONAVIRUS, NAA: SARS-CoV-2, NAA: NOT DETECTED

## 2020-06-17 ENCOUNTER — Other Ambulatory Visit: Payer: Self-pay

## 2020-06-17 ENCOUNTER — Emergency Department (HOSPITAL_COMMUNITY): Payer: Self-pay

## 2020-06-17 ENCOUNTER — Encounter (HOSPITAL_COMMUNITY): Payer: Self-pay | Admitting: *Deleted

## 2020-06-17 ENCOUNTER — Emergency Department (HOSPITAL_COMMUNITY)
Admission: EM | Admit: 2020-06-17 | Discharge: 2020-06-17 | Disposition: A | Discharge status: Home / Self Care | Payer: Self-pay | Attending: Emergency Medicine | Admitting: Emergency Medicine

## 2020-06-17 DIAGNOSIS — R0602 Shortness of breath: Secondary | ICD-10-CM | POA: Insufficient documentation

## 2020-06-17 DIAGNOSIS — R519 Headache, unspecified: Secondary | ICD-10-CM | POA: Insufficient documentation

## 2020-06-17 DIAGNOSIS — J069 Acute upper respiratory infection, unspecified: Secondary | ICD-10-CM | POA: Insufficient documentation

## 2020-06-17 DIAGNOSIS — R05 Cough: Secondary | ICD-10-CM | POA: Insufficient documentation

## 2020-06-17 DIAGNOSIS — J45909 Unspecified asthma, uncomplicated: Secondary | ICD-10-CM | POA: Insufficient documentation

## 2020-06-17 DIAGNOSIS — N189 Chronic kidney disease, unspecified: Secondary | ICD-10-CM | POA: Insufficient documentation

## 2020-06-17 DIAGNOSIS — Z20822 Contact with and (suspected) exposure to covid-19: Secondary | ICD-10-CM | POA: Insufficient documentation

## 2020-06-17 HISTORY — DX: Unspecified asthma, uncomplicated: J45.909

## 2020-06-17 LAB — SARS CORONAVIRUS 2 BY RT PCR (HOSPITAL ORDER, PERFORMED IN ~~LOC~~ HOSPITAL LAB): SARS Coronavirus 2: NEGATIVE

## 2020-06-17 NOTE — Discharge Instructions (Signed)
Home to rest and quarantine.  Take naproxen and Tylenol as needed as directed. Follow-up in your MyChart for your Covid test results which should be available later this afternoon. If your test is negative, continue to quarantine until you are fever free without taking fever reducing medication for 72 hours.  Consider repeat testing at local pharmacy test site for free if your test is negative and you are still symptomatic.  If your test is positive, you should quarantine for total of 10 days since symptoms started.

## 2020-06-17 NOTE — ED Notes (Signed)
Discussed with pt about quarantine and how she could see her test results and how she would be notified of positive results.  Pt verbalized understanding.

## 2020-06-17 NOTE — ED Provider Notes (Signed)
[facility] EMERGENCY DEPARTMENT Provider Note   CSN: [id number] Arrival date & time: 06/17/20  8127     History Chief Complaint  Patient presents with  . Fever  . Generalized Body Aches    Teresa Beck is a 34 y.o. female.  34 year old female presents with complaint of cough, shortness of breath, body aches and headache.  Patient reports fever with temperature of 100.4 yesterday.  Patient is not taking anything for her symptoms.  Patient is vaccinated against COVID-19, had her vaccine series 2 months ago.  No other complaints or concerns today.  Teresa Beck was evaluated in Emergency Department on 06/17/2020 for the symptoms described in the history of present illness. She was evaluated in the context of the global COVID-19 pandemic, which necessitated consideration that the patient might be at risk for infection with the SARS-CoV-2 virus that causes COVID-19. Institutional protocols and algorithms that pertain to the evaluation of patients at risk for COVID-19 are in a state of rapid change based on information released by regulatory bodies including the CDC and federal and state organizations. These policies and algorithms were followed during the patient's care in the ED.         Past Medical History:  Diagnosis Date  . Asthma   . Chronic kidney disease    stones and infection  . Complication of anesthesia   . Ovarian cyst     There are no problems to display for this patient.   Past Surgical History:  Procedure Laterality Date  . NO PAST SURGERIES       OB History    Gravida  3   Para  3   Term  1   Preterm  2   AB  0   Living  2     SAB  0   TAB  0   Ectopic  0   Multiple  0   Live Births  3           No family history on file.  Social History   Tobacco Use  . Smoking status: Never Smoker  . Smokeless tobacco: Never Used  Substance Use Topics  . Alcohol use: Yes  . Drug use: No    Home Medications Prior to  Admission medications   Medication Sig Start Date End Date Taking? Authorizing Provider  albuterol (PROVENTIL HFA;VENTOLIN HFA) 108 (90 Base) MCG/ACT inhaler Inhale 2 puffs into the lungs every 6 (six) hours as needed for wheezing or shortness of breath.   Yes [provider]  Calcium Carb-Cholecalciferol (CALCIUM 1000 + D PO) Take 2 tablets by mouth every morning.   Yes [provider]  naproxen sodium (ANAPROX) 220 MG tablet Take 220 mg by mouth 2 (two) times daily as needed (pain).   Yes [provider]  ibuprofen (ADVIL,MOTRIN) 800 MG tablet Take 1 tablet (800 mg total) by mouth every 8 (eight) hours as needed. Patient not taking: Reported on 06/17/2020 01/29/17   Georgiana Shore, PA-C  indomethacin (INDOCIN) 25 MG capsule Take 25 mg by mouth 3 (three) times daily with meals. Patient not taking: Reported on 06/17/2020    [provider]  methocarbamol (ROBAXIN) 500 MG tablet Take 1 tablet (500 mg total) by mouth at bedtime as needed for muscle spasms. Patient not taking: Reported on 07/17/2017 01/29/17   Georgiana Shore, PA-C    Allergies    Patient has no known allergies.  Review of Systems   Review of Systems  Constitutional: Positive for fever.  HENT: Negative for congestion.   Respiratory: Positive for cough and shortness of breath.   Gastrointestinal: Negative for abdominal pain, diarrhea and vomiting.  Genitourinary: Negative for dysuria.  Musculoskeletal: Positive for arthralgias and myalgias. Negative for neck pain and neck stiffness.  Skin: Negative for rash.  Allergic/Immunologic: Negative for immunocompromised state.  Neurological: Positive for headaches. Negative for weakness.  Hematological: Negative for adenopathy.  All other systems reviewed and are negative.   Physical Exam Updated Vital Signs BP 108/74   Pulse 71   Temp 98.4 F (36.9 C)   Resp 18   Ht 5\' 2"  (1.575 m)   Wt 86.2 kg   LMP 06/11/2020   SpO2 97%   BMI 34.75  kg/m   Physical Exam Vitals and nursing note reviewed.  Constitutional:      General: She is not in acute distress.    Appearance: She is well-developed. She is not diaphoretic.  HENT:     Head: Normocephalic and atraumatic.     Right Ear: Tympanic membrane and ear canal normal.     Left Ear: Tympanic membrane and ear canal normal.  Eyes:     Conjunctiva/sclera: Conjunctivae normal.  Cardiovascular:     Rate and Rhythm: Normal rate and regular rhythm.     Pulses: Normal pulses.     Heart sounds: Normal heart sounds.  Pulmonary:     Effort: Pulmonary effort is normal.     Breath sounds: Normal breath sounds.  Musculoskeletal:     Cervical back: Normal range of motion and neck supple. No rigidity or tenderness.  Lymphadenopathy:     Cervical: No cervical adenopathy.  Skin:    General: Skin is warm and dry.     Findings: No erythema or rash.  Neurological:     Mental Status: She is alert and oriented to person, place, and time.  Psychiatric:        Behavior: Behavior normal.     ED Results / Procedures / Treatments   Labs (all labs ordered are listed, but only abnormal results are displayed) Labs Reviewed  SARS CORONAVIRUS 2 BY RT PCR (HOSPITAL ORDER, PERFORMED IN [facility])    EKG None  Radiology DG Chest Port 1 View  Result Date: 06/17/2020 CLINICAL DATA:  Shortness of breath, fever, cough, body aches EXAM: PORTABLE CHEST 1 VIEW COMPARISON:  01/29/2017 FINDINGS: Lungs are clear.  No pleural effusion or pneumothorax. The heart is normal in size. IMPRESSION: No evidence of acute cardiopulmonary disease. Electronically Signed   By: 01/31/2017 M.D.   On: 06/17/2020 11:51    Procedures Procedures (including critical care time)  Medications Ordered in ED Medications - No data to display  ED Course  I have reviewed the triage vital signs and the nursing notes.  Pertinent labs & imaging results that were available during my care of the patient  were reviewed by me and considered in my medical decision making (see chart for details).  Clinical Course as of Jun 18 1199  Sat Jun 17, 2020  [id number] 34 year old female with complaint of fever with URI symptoms and body aches x2 days.  No known sick contacts, is vaccinated against COVID-19. On exam, patient is well-appearing, lung sounds are clear, vitals are reassuring with 97% oxygenation on room air. Discussed with patient possible viral illness versus Covid, recommend quarantine until she knows her Covid test results, if positive should quarantine for 10 days, if negative, recommend quarantine for 72  hours past last fever without antipyretic. Follow-up with PCP, return to ED for new or worsening symptoms.   [LM]  1159 Chest x-ray unremarkable.   [LM]    Clinical Course User Index [LM] Alden Hipp   MDM Rules/Calculators/A&P                          Final Clinical Impression(s) / ED Diagnoses Final diagnoses:  Viral upper respiratory tract infection    Rx / DC Orders ED Discharge Orders    None       Jeannie Fend, PA-C 06/17/20 1200    Bethann Berkshire, MD 06/20/20 1029

## 2020-06-17 NOTE — ED Triage Notes (Signed)
Fever, chills, body aches and particular back pain onset 2 days ago

## 2020-11-11 NOTE — L&D Delivery Note (Signed)
OB/GYN Faculty Practice Delivery Note  Teresa Beck is a 35 y.o. [id number] s/p vaginal delivery at [redacted]w[redacted]d. She was admitted for SROM with light meconium.   ROM: 12h 34m with light MEC fluid GBS Status: GBS positive Maximum Maternal Temperature:  Temp (24hrs), Avg:98.1 F (36.7 C), Min:97.7 F (36.5 C), Max:98.5 F (36.9 C)    Labor Progress: Patient arrived at 1.5 cm dilation and was induced with Pitocin.   Delivery Date/Time: 07/15/2021 at 0037 Delivery: Called to room and patient was complete and pushing. Head delivered in ROA position. No nuchal cord present. Shoulder and body delivered in usual fashion. Infant with spontaneous cry, placed on mother's abdomen, dried and stimulated. Cord clamped x 2 after 1-minute delay, and cut by FOB. Cord blood drawn. Placenta delivered spontaneously with gentle cord traction. Fundus firm with massage and Pitocin. Labia, perineum, vagina, and cervix inspected with first-degree perineal laceration which was hemostatic after light pressure.   Placenta: Spontaneous, intact, three-vessel cord, 2 infarctions appreciated Complications: None Lacerations: First-degree perineal laceration which was hemostatic after pressure was applied to the wound EBL: 100 mL Analgesia: Epidural  Infant: APGAR (1 MIN): 9   APGAR (5 MINS): 9   APGAR (10 MINS):    Weight: Pending  Derrel Nip, MD  PGY-3, [facility]  07/15/2021 1:08 AM

## 2021-07-14 ENCOUNTER — Inpatient Hospital Stay (HOSPITAL_COMMUNITY)
Admission: EM | Admit: 2021-07-14 | Discharge: 2021-07-16 | DRG: 807 | Disposition: A | Discharge status: Home / Self Care | Payer: Medicaid Other | Attending: Obstetrics and Gynecology | Admitting: Obstetrics and Gynecology

## 2021-07-14 ENCOUNTER — Other Ambulatory Visit: Payer: Self-pay

## 2021-07-14 ENCOUNTER — Inpatient Hospital Stay (HOSPITAL_COMMUNITY): Payer: Medicaid Other | Admitting: Anesthesiology

## 2021-07-14 ENCOUNTER — Encounter (HOSPITAL_COMMUNITY): Payer: Self-pay

## 2021-07-14 DIAGNOSIS — O99824 Streptococcus B carrier state complicating childbirth: Secondary | ICD-10-CM | POA: Diagnosis present

## 2021-07-14 DIAGNOSIS — Z3A38 38 weeks gestation of pregnancy: Secondary | ICD-10-CM | POA: Diagnosis not present

## 2021-07-14 DIAGNOSIS — Z20822 Contact with and (suspected) exposure to covid-19: Secondary | ICD-10-CM | POA: Diagnosis present

## 2021-07-14 DIAGNOSIS — O4292 Full-term premature rupture of membranes, unspecified as to length of time between rupture and onset of labor: Principal | ICD-10-CM | POA: Diagnosis present

## 2021-07-14 DIAGNOSIS — O26893 Other specified pregnancy related conditions, third trimester: Secondary | ICD-10-CM | POA: Diagnosis present

## 2021-07-14 LAB — CBC
HCT: 34.3 % — ABNORMAL LOW (ref 36.0–46.0)
Hemoglobin: 11.3 g/dL — ABNORMAL LOW (ref 12.0–15.0)
MCH: 28 pg (ref 26.0–34.0)
MCHC: 32.9 g/dL (ref 30.0–36.0)
MCV: 85.1 fL (ref 80.0–100.0)
Platelets: 299 10*3/uL (ref 150–400)
RBC: 4.03 MIL/uL (ref 3.87–5.11)
RDW: 16.4 % — ABNORMAL HIGH (ref 11.5–15.5)
WBC: 11.6 10*3/uL — ABNORMAL HIGH (ref 4.0–10.5)
nRBC: 0 % (ref 0.0–0.2)

## 2021-07-14 LAB — DIFFERENTIAL
Abs Immature Granulocytes: 0.11 10*3/uL — ABNORMAL HIGH (ref 0.00–0.07)
Basophils Absolute: 0 10*3/uL (ref 0.0–0.1)
Basophils Relative: 0 %
Eosinophils Absolute: 0.1 10*3/uL (ref 0.0–0.5)
Eosinophils Relative: 1 %
Immature Granulocytes: 1 %
Lymphocytes Relative: 21 %
Lymphs Abs: 2.4 10*3/uL (ref 0.7–4.0)
Monocytes Absolute: 0.8 10*3/uL (ref 0.1–1.0)
Monocytes Relative: 7 %
Neutro Abs: 8.2 10*3/uL — ABNORMAL HIGH (ref 1.7–7.7)
Neutrophils Relative %: 70 %

## 2021-07-14 LAB — RESP PANEL BY RT-PCR (FLU A&B, COVID) ARPGX2
Influenza A by PCR: NEGATIVE
Influenza B by PCR: NEGATIVE
SARS Coronavirus 2 by RT PCR: NEGATIVE

## 2021-07-14 LAB — COMPREHENSIVE METABOLIC PANEL
ALT: 14 U/L (ref 0–44)
AST: 16 U/L (ref 15–41)
Albumin: 2.8 g/dL — ABNORMAL LOW (ref 3.5–5.0)
Alkaline Phosphatase: 190 U/L — ABNORMAL HIGH (ref 38–126)
Anion gap: 11 (ref 5–15)
BUN: 7 mg/dL (ref 6–20)
CO2: 19 mmol/L — ABNORMAL LOW (ref 22–32)
Calcium: 9 mg/dL (ref 8.9–10.3)
Chloride: 104 mmol/L (ref 98–111)
Creatinine, Ser: 0.54 mg/dL (ref 0.44–1.00)
GFR, Estimated: >60 mL/min (ref >60)
Glucose, Bld: 83 mg/dL (ref 70–99)
Potassium: 4 mmol/L (ref 3.5–5.1)
Sodium: 134 mmol/L — ABNORMAL LOW (ref 135–145)
Total Bilirubin: 0.4 mg/dL (ref 0.3–1.2)
Total Protein: 7 g/dL (ref 6.5–8.1)

## 2021-07-14 LAB — HEPATITIS B SURFACE ANTIGEN: Hepatitis B Surface Ag: NONREACTIVE

## 2021-07-14 LAB — RAPID HIV SCREEN (HIV 1/2 AB+AG)
HIV 1/2 Antibodies: NONREACTIVE
HIV-1 P24 Antigen - HIV24: NONREACTIVE

## 2021-07-14 LAB — TYPE AND SCREEN
ABO/RH(D): A POS
Antibody Screen: NEGATIVE

## 2021-07-14 MED ORDER — PENICILLIN G POT IN DEXTROSE 60000 UNIT/ML IV SOLN: 3.0000 10*6.[IU] | INTRAVENOUS | Status: DC

## 2021-07-14 MED ORDER — SOD CITRATE-CITRIC ACID 500-334 MG/5ML PO SOLN
30.0000 mL | ORAL | Status: DC | PRN
Start: 2021-07-14 — End: 2021-07-15

## 2021-07-14 MED ORDER — LACTATED RINGERS IV SOLN: 500.0000 mL | Freq: Once | INTRAVENOUS | Status: DC

## 2021-07-14 MED ORDER — ONDANSETRON HCL 4 MG/2ML IJ SOLN: 4.0000 mg | Freq: Four times a day (QID) | INTRAMUSCULAR | Status: DC | PRN

## 2021-07-14 MED ORDER — LACTATED RINGERS IV SOLN: 500.0000 mL | INTRAVENOUS | Status: DC | PRN

## 2021-07-14 MED ORDER — LIDOCAINE HCL (PF) 1 % IJ SOLN: 30.0000 mL | INTRAMUSCULAR | Status: DC | PRN

## 2021-07-14 MED ORDER — LACTATED RINGERS IV SOLN: INTRAVENOUS | Status: DC

## 2021-07-14 MED ORDER — OXYCODONE-ACETAMINOPHEN 5-325 MG PO TABS
1.0000 | ORAL_TABLET | ORAL | Status: DC | PRN
Start: 2021-07-14 — End: 2021-07-15

## 2021-07-14 MED ORDER — SODIUM CHLORIDE 0.9 % IV SOLN
5.0000 10*6.[IU] | Freq: Once | INTRAVENOUS | Status: AC
  Administered 2021-07-14: 5 10*6.[IU] via INTRAVENOUS
  Filled 2021-07-14: qty 5

## 2021-07-14 MED ORDER — FENTANYL-BUPIVACAINE-NACL 0.5-0.125-0.9 MG/250ML-% EP SOLN
EPIDURAL | Status: DC | PRN
  Administered 2021-07-14: 12 mL/h via EPIDURAL

## 2021-07-14 MED ORDER — OXYTOCIN-SODIUM CHLORIDE 30-0.9 UT/500ML-% IV SOLN
2.5000 [IU]/h | INTRAVENOUS | Status: DC
  Filled 2021-07-14: qty 500

## 2021-07-14 MED ORDER — TERBUTALINE SULFATE 1 MG/ML IJ SOLN: 0.2500 mg | Freq: Once | INTRAMUSCULAR | Status: DC | PRN

## 2021-07-14 MED ORDER — OXYTOCIN BOLUS FROM INFUSION: 333.0000 mL | Freq: Once | INTRAVENOUS | Status: DC

## 2021-07-14 MED ORDER — FENTANYL-BUPIVACAINE-NACL 0.5-0.125-0.9 MG/250ML-% EP SOLN: 12.0000 mL/h | EPIDURAL | Status: DC | PRN

## 2021-07-14 MED ORDER — FENTANYL-BUPIVACAINE-NACL 0.5-0.125-0.9 MG/250ML-% EP SOLN
EPIDURAL | Status: AC
  Filled 2021-07-14: qty 250

## 2021-07-14 MED ORDER — EPHEDRINE 5 MG/ML INJ: 10.0000 mg | INTRAVENOUS | Status: DC | PRN

## 2021-07-14 MED ORDER — OXYCODONE-ACETAMINOPHEN 5-325 MG PO TABS: 1.0000 | ORAL_TABLET | ORAL | Status: DC | PRN

## 2021-07-14 MED ORDER — ACETAMINOPHEN 325 MG PO TABS
650.0000 mg | ORAL_TABLET | ORAL | Status: DC | PRN
Start: 2021-07-14 — End: 2021-07-15

## 2021-07-14 MED ORDER — OXYTOCIN-SODIUM CHLORIDE 30-0.9 UT/500ML-% IV SOLN
1.0000 m[IU]/min | INTRAVENOUS | Status: DC
  Administered 2021-07-14: 2 m[IU]/min via INTRAVENOUS

## 2021-07-14 MED ORDER — OXYTOCIN-SODIUM CHLORIDE 30-0.9 UT/500ML-% IV SOLN: 2.5000 [IU]/h | INTRAVENOUS | Status: DC

## 2021-07-14 MED ORDER — OXYTOCIN BOLUS FROM INFUSION
333.0000 mL | Freq: Once | INTRAVENOUS | Status: AC
  Administered 2021-07-15: 333 mL via INTRAVENOUS

## 2021-07-14 MED ORDER — LIDOCAINE HCL (PF) 1 % IJ SOLN
INTRAMUSCULAR | Status: DC | PRN
  Administered 2021-07-14: 5 mL via EPIDURAL

## 2021-07-14 MED ORDER — FENTANYL CITRATE (PF) 100 MCG/2ML IJ SOLN: 100.0000 ug | INTRAMUSCULAR | Status: DC | PRN

## 2021-07-14 MED ORDER — DIPHENHYDRAMINE HCL 50 MG/ML IJ SOLN
12.5000 mg | INTRAMUSCULAR | Status: DC | PRN
Start: 2021-07-14 — End: 2021-07-15

## 2021-07-14 MED ORDER — PHENYLEPHRINE 40 MCG/ML (10ML) SYRINGE FOR IV PUSH (FOR BLOOD PRESSURE SUPPORT): 80.0000 ug | PREFILLED_SYRINGE | INTRAVENOUS | Status: DC | PRN

## 2021-07-14 MED ORDER — SOD CITRATE-CITRIC ACID 500-334 MG/5ML PO SOLN: 30.0000 mL | ORAL | Status: DC | PRN

## 2021-07-14 MED ORDER — ACETAMINOPHEN 325 MG PO TABS: 650.0000 mg | ORAL_TABLET | ORAL | Status: DC | PRN

## 2021-07-14 MED ORDER — OXYCODONE-ACETAMINOPHEN 5-325 MG PO TABS: 2.0000 | ORAL_TABLET | ORAL | Status: DC | PRN

## 2021-07-14 NOTE — MAU Provider Note (Signed)
Chief Complaint  Patient presents with   Rupture of Membranes    Event Date/Time   First Provider Initiated Contact with Patient 07/14/21 1834     S: Teresa Beck  is a 36 y.o. y.o. year old G12P1202 female at [redacted]w[redacted]d weeks gestation who presents to MAU reporting leaking of clear fluid since 0900. Didn't realize that it was amniotic fluid initially.     Contractions: Mild Vaginal bleeding: scant Fetal movement: Decreased  Gets prenatal care at [facility] in [place]. Denies any pregnancy complications. Reports pos GBS. No record visible in Care Everywhere except for US's  O: Patient Vitals for the past 24 hrs:  BP Temp Pulse Resp SpO2 Height Weight  07/14/21 1749 -- -- -- -- -- 5\' 2"  (1.575 m) 99.8 kg  07/14/21 1744 (!) 129/91 98 F (36.7 C) 88 16 99 % -- --   General: NAD Heart: Regular rate Lungs: Normal rate and effort Abd: Soft, NT, Gravid, S=D Pelvic: NEFG, grossly ruptured light MSF, scant bloody show.  Dilation: 1.5 Effacement (%): 80 Cervical Position: Middle Station: -3 Presentation: Vertex Exam by:: [email or web address] [name] CNM  EFM: 135, Moderate variability, 15 x 15 accelerations, no decelerations Toco: Irreg, mild  MDM Grossly ruptured w/ MSF. Latent labor.   A: [redacted]w[redacted]d week IUP SROM, early labor FHR reactive  P: Admit to L&D  Prenatal panel Request records.   [redacted]w[redacted]d, Katrinka Blazing, [place] 07/14/2021 6:33 PM  2

## 2021-07-14 NOTE — Anesthesia Procedure Notes (Signed)
Epidural Patient location during procedure: OB Start time: 07/14/2021 11:21 PM End time: 07/14/2021 11:36 PM  Staffing Anesthesiologist: Trevor Iha, MD Performed: anesthesiologist   Preanesthetic Checklist Completed: patient identified, IV checked, site marked, risks and benefits discussed, surgical consent, monitors and equipment checked, pre-op evaluation and timeout performed  Epidural Patient position: sitting Prep: DuraPrep and site prepped and draped Patient monitoring: continuous pulse ox and blood pressure Approach: midline Location: L3-L4 Injection technique: LOR air  Needle:  Needle type: Tuohy  Needle gauge: 18 G Needle length: 9 cm and 9 Needle insertion depth: 7 cm Catheter type: closed end flexible Catheter size: 20 Guage Catheter at skin depth: 12 cm Test dose: negative  Assessment Events: blood not aspirated, injection not painful, no injection resistance, no paresthesia and negative IV test  Additional Notes Patient identified. Risks/Benefits/Options discussed with patient including but not limited to bleeding, infection, nerve damage, paralysis, failed block, incomplete pain control, headache, blood pressure changes, nausea, vomiting, reactions to medication both or allergic, itching and postpartum back pain. Confirmed with bedside nurse the patient's most recent platelet count. Confirmed with patient that they are not currently taking any anticoagulation, have any bleeding history or any family history of bleeding disorders. Patient expressed understanding and wished to proceed. All questions were answered. Sterile technique was used throughout the entire procedure. Please see nursing notes for vital signs. Test dose was given through epidural needle and negative prior to continuing to dose epidural or start infusion. Warning signs of high block given to the patient including shortness of breath, tingling/numbness in hands, complete motor block, or any  concerning symptoms with instructions to call for help. Patient was given instructions on fall risk and not to get out of bed. All questions and concerns addressed with instructions to call with any issues.  2 Attempt (S) . Patient tolerated procedure well.

## 2021-07-14 NOTE — H&P (Addendum)
OBSTETRIC ADMISSION HISTORY AND PHYSICAL  Joyleen BRITTANIA SUDBECK is a 35 y.o. female [phone] with IUP at [redacted]w[redacted]d by 12-week [place] presenting for SROM at 0900 this am with light meconium. She received her [facility] at a clinic in [place], we therefore have no prenatal records, aside from a recent US at [redacted]w[redacted]d.   She reports +FMs, No LOF, no VB, no blurry vision, headaches or peripheral edema, and RUQ pain.  She plans on breast feeding. She request outpatient nexplanon for birth control. She received her prenatal care in [place]   Dating: By 12w [place] --->  Estimated Date of Delivery: 07/24/21  Sono:    @[redacted]w[redacted]d , CWD, normal anatomy, cephalic presentation, 3028g, EFW   Prenatal History/Complications:   Past Medical History: Past Medical History:  Diagnosis Date   Asthma    Chronic kidney disease    stones and infection   Ovarian cyst     Past Surgical History: Past Surgical History:  Procedure Laterality Date   NO PAST SURGERIES      Obstetrical History: OB History     Gravida  4   Para  3   Term  1   Preterm  2   AB  0   Living  2      SAB  0   IAB  0   Ectopic  0   Multiple  0   Live Births  3           Social History Social History   Socioeconomic History   Marital status: Single    Spouse name: Not on file   Number of children: Not on file   Years of education: Not on file   Highest education level: Not on file  Occupational History   Not on file  Tobacco Use   Smoking status: Never   Smokeless tobacco: Never  Substance and Sexual Activity   Alcohol use: Yes   Drug use: No   Sexual activity: Yes    Birth control/protection: None  Other Topics Concern   Not on file  Social History Narrative   Not on file   Social Determinants of Health   Financial Resource Strain: Not on file  Food Insecurity: Not on file  Transportation Needs: Not on file  Physical Activity: Not on file  Stress: Not on file  Social Connections: Not on file     Family History: History reviewed. No pertinent family history.  Allergies: No Known Allergies  Medications Prior to Admission  Medication Sig Dispense Refill Last Dose   albuterol (PROVENTIL HFA;VENTOLIN HFA) 108 (90 Base) MCG/ACT inhaler Inhale 2 puffs into the lungs every 6 (six) hours as needed for wheezing or shortness of breath.      Calcium Carb-Cholecalciferol (CALCIUM 1000 + D PO) Take 2 tablets by mouth every morning.      ibuprofen (ADVIL,MOTRIN) 800 MG tablet Take 1 tablet (800 mg total) by mouth every 8 (eight) hours as needed. (Patient not taking: Reported on 06/17/2020) 21 tablet 0    indomethacin (INDOCIN) 25 MG capsule Take 25 mg by mouth 3 (three) times daily with meals. (Patient not taking: Reported on 06/17/2020)      methocarbamol (ROBAXIN) 500 MG tablet Take 1 tablet (500 mg total) by mouth at bedtime as needed for muscle spasms. (Patient not taking: Reported on 07/17/2017) 8 tablet 0    naproxen sodium (ANAPROX) 220 MG tablet Take 220 mg by mouth 2 (two) times daily as needed (pain).  Review of Systems   All systems reviewed and negative except as stated in HPI  Blood pressure 115/72, pulse 81, temperature 98 F (36.7 C), resp. rate 18, height 5\' 2"  (1.575 m), weight 99.8 kg, last menstrual period 10/28/2020, SpO2 99 %, unknown if currently breastfeeding. General appearance: alert, cooperative, and no distress Lungs: Normal WOB on RA Heart: regular rate and rhythm Abdomen: soft, non-tender; Pelvic: Normal external female genitalia Extremities: Homans sign is negative, no sign of DVT Presentation: cephalic Fetal monitoringBaseline: 130 bpm, Variability: Good {> 6 bpm), and Accelerations: Reactive Uterine activity Mild contractions every 3-4 minutes Dilation: 1.5 Effacement (%): 80 Station: -3 Exam by:: [email or web address], md   Prenatal labs: Patient received PNC at outside facility, therefore we do not have access to the following labs. Clinical staff is working  to have records faxed in.  ABO, Rh: --/--/PENDING (09/03 1915) Antibody: PENDING (09/03 1915) Rubella:   RPR:    HBsAg:    HIV:    GBS:   Positive, per patient report 1 hr Glucola  Genetic screening   Anatomy 01-24-2000 Normal  Prenatal Transfer Tool  Maternal Diabetes: No Genetic Screening: Normal Maternal Ultrasounds/Referrals: Normal Fetal Ultrasounds or other Referrals:  None Maternal Substance Abuse:  No Significant Maternal Medications:  None Significant Maternal Lab Results: Group B Strep positive  Results for orders placed or performed during the hospital encounter of 07/14/21 (from the past 24 hour(s))  Resp Panel by RT-PCR (Flu A&B, Covid) Nasopharyngeal Swab   Collection Time: 07/14/21  6:36 PM   Specimen: Nasopharyngeal Swab; Nasopharyngeal(NP) swabs in vial transport medium  Result Value Ref Range   SARS Coronavirus 2 by RT PCR NEGATIVE NEGATIVE   Influenza A by PCR NEGATIVE NEGATIVE   Influenza B by PCR NEGATIVE NEGATIVE  CBC   Collection Time: 07/14/21  6:58 PM  Result Value Ref Range   WBC 11.6 (H) 4.0 - 10.5 K/uL   RBC 4.03 3.87 - 5.11 MIL/uL   Hemoglobin 11.3 (L) 12.0 - 15.0 g/dL   HCT 09/13/21 (L) 76.1 - 60.7 %   MCV 85.1 80.0 - 100.0 fL   MCH 28.0 26.0 - 34.0 pg   MCHC 32.9 30.0 - 36.0 g/dL   RDW 37.1 (H) 06.2 - 69.4 %   Platelets 299 150 - 400 K/uL   nRBC 0.0 0.0 - 0.2 %  Differential   Collection Time: 07/14/21  6:58 PM  Result Value Ref Range   Neutrophils Relative % 70 %   Neutro Abs 8.2 (H) 1.7 - 7.7 K/uL   Lymphocytes Relative 21 %   Lymphs Abs 2.4 0.7 - 4.0 K/uL   Monocytes Relative 7 %   Monocytes Absolute 0.8 0.1 - 1.0 K/uL   Eosinophils Relative 1 %   Eosinophils Absolute 0.1 0.0 - 0.5 K/uL   Basophils Relative 0 %   Basophils Absolute 0.0 0.0 - 0.1 K/uL   Immature Granulocytes 1 %   Abs Immature Granulocytes 0.11 (H) 0.00 - 0.07 K/uL  Type and screen [facility]   Collection Time: 07/14/21  7:15 PM  Result Value Ref Range    ABO/RH(D) PENDING    Antibody Screen PENDING    Sample Expiration      07/17/2021,2359 Performed at [facility], [address] [place], [place]     Patient Active Problem List   Diagnosis Date Noted   Labor and delivery, indication for care 07/14/2021    Assessment/Plan:  FATEMAH POURCIAU is a  35 y.o. [id number] at [redacted]w[redacted]d here for SOL after SROM with light meconium at 0900.  #Labor:Given SROM 12 hours ago will want to augment labor. Patient is multiparous and cervix is quite soft. Will start Pitocin 2x2.  #Pain: Planning epidural #FWB: Cat I #ID: GBS pos > pcn #MOF: Breast #MOC:Outpatient nexplanon  #Circ:  N/a girl  Dorothyann Gibbs, MD  07/14/2021, 8:27 PM  I spoke with and examined patient and agree with resident/PA-S/MS/SNM's note and plan of care.  Cheral Marker, CNM, [facility] 07/14/2021 10:07 PM

## 2021-07-14 NOTE — Anesthesia Preprocedure Evaluation (Signed)
Anesthesia Evaluation  Patient identified by MRN, date of birth, ID band Patient awake    Reviewed: Allergy & Precautions, NPO status , Patient's Chart, lab work & pertinent test results  Airway Mallampati: III  TM Distance: >3 FB Neck ROM: Full    Dental no notable dental hx. (+) Teeth Intact, Dental Advisory Given   Pulmonary asthma ,    Pulmonary exam normal breath sounds clear to auscultation       Cardiovascular Exercise Tolerance: Good Normal cardiovascular exam Rhythm:Regular Rate:Normal     Neuro/Psych negative neurological ROS     GI/Hepatic negative GI ROS, Neg liver ROS,   Endo/Other    Renal/GU      Musculoskeletal   Abdominal (+) + obese (BMi 40.2),   Peds  Hematology Lab Results      Component                Value               Date                      WBC                      11.6 (H)            07/14/2021                HGB                      11.3 (L)            07/14/2021                HCT                      34.3 (L)            07/14/2021                MCV                      85.1                07/14/2021                PLT                      299                 07/14/2021              Anesthesia Other Findings   Reproductive/Obstetrics (+) Pregnancy                             Anesthesia Physical Anesthesia Plan  ASA: 3  Anesthesia Plan: Epidural   Post-op Pain Management:    Induction:   PONV Risk Score and Plan:   Airway Management Planned:   Additional Equipment:   Intra-op Plan:   Post-operative Plan:   Informed Consent: I have reviewed the patients History and Physical, chart, labs and discussed the procedure including the risks, benefits and alternatives for the proposed anesthesia with the patient or authorized representative who has indicated his/her understanding and acceptance.       Plan Discussed with:   Anesthesia Plan  Comments: (38.4 G4P2 w BMI 40,2 for LEA)  Anesthesia Quick Evaluation  

## 2021-07-14 NOTE — ED Provider Notes (Signed)
Emergency Medicine Provider OB Triage Evaluation Note  Teresa Beck is a 35 y.o. female, [id number], at Unknown gestation who presents to the emergency department with complaints of leaking amniotic fluid.  Review of  Systems  Positive: leaking amniotic fluid, mild abdominal contraction Negative: fever, abd pain, vaginal bleeding  Physical Exam  BP (!) 129/91   Pulse 88   Temp 98 F (36.7 C)   Resp 16   Ht 5\' 2"  (1.575 m)   Wt 99.8 kg   SpO2 99%   BMI 40.24 kg/m  General: Awake, no distress  HEENT: Atraumatic  Resp: Normal effort  Cardiac: Normal rate Abd: Nondistended, nontender  MSK: Moves all extremities without difficulty Neuro: Speech clear  Medical Decision Making  Pt evaluated for pregnancy concern and is stable for transfer to MAU. Pt is in agreement with plan for transfer.  5:50 PM Discussed with MAU APP, , who accepts patient in transfer.  Clinical Impression  No diagnosis found.     [place], PA-C 07/14/21 1752    09/13/21, MD 07/15/21 [phone]

## 2021-07-14 NOTE — ED Triage Notes (Signed)
Pt arrives POV for eval of leaking fluid onset this AM. Pt reports this is her 3rd pregnancy. Reports lower abd/lower back pain/cramping every 3 hours. Denies vag bleeding or urge to push.

## 2021-07-15 ENCOUNTER — Encounter (HOSPITAL_COMMUNITY): Payer: Self-pay | Admitting: Obstetrics and Gynecology

## 2021-07-15 DIAGNOSIS — O99820 Streptococcus B carrier state complicating pregnancy: Secondary | ICD-10-CM

## 2021-07-15 DIAGNOSIS — O4202 Full-term premature rupture of membranes, onset of labor within 24 hours of rupture: Secondary | ICD-10-CM

## 2021-07-15 DIAGNOSIS — Z3A38 38 weeks gestation of pregnancy: Secondary | ICD-10-CM

## 2021-07-15 LAB — CBC
HCT: 29.1 % — ABNORMAL LOW (ref 36.0–46.0)
Hemoglobin: 9.5 g/dL — ABNORMAL LOW (ref 12.0–15.0)
MCH: 28.1 pg (ref 26.0–34.0)
MCHC: 32.6 g/dL (ref 30.0–36.0)
MCV: 86.1 fL (ref 80.0–100.0)
Platelets: 258 10*3/uL (ref 150–400)
RBC: 3.38 MIL/uL — ABNORMAL LOW (ref 3.87–5.11)
RDW: 16.6 % — ABNORMAL HIGH (ref 11.5–15.5)
WBC: 17.8 10*3/uL — ABNORMAL HIGH (ref 4.0–10.5)
nRBC: 0 % (ref 0.0–0.2)

## 2021-07-15 LAB — RPR: RPR Ser Ql: NONREACTIVE

## 2021-07-15 MED ORDER — PNEUMOCOCCAL VAC POLYVALENT 25 MCG/0.5ML IJ INJ
0.5000 mL | INJECTION | INTRAMUSCULAR | Status: DC
  Filled 2021-07-15: qty 0.5

## 2021-07-15 MED ORDER — ZOLPIDEM TARTRATE 5 MG PO TABS: 5.0000 mg | ORAL_TABLET | Freq: Every evening | ORAL | Status: DC | PRN

## 2021-07-15 MED ORDER — COCONUT OIL OIL: 1.0000 "application " | TOPICAL_OIL | Status: DC | PRN

## 2021-07-15 MED ORDER — ONDANSETRON HCL 4 MG PO TABS: 4.0000 mg | ORAL_TABLET | ORAL | Status: DC | PRN

## 2021-07-15 MED ORDER — ONDANSETRON HCL 4 MG/2ML IJ SOLN: 4.0000 mg | INTRAMUSCULAR | Status: DC | PRN

## 2021-07-15 MED ORDER — DIPHENHYDRAMINE HCL 25 MG PO CAPS: 25.0000 mg | ORAL_CAPSULE | Freq: Four times a day (QID) | ORAL | Status: DC | PRN

## 2021-07-15 MED ORDER — SENNOSIDES-DOCUSATE SODIUM 8.6-50 MG PO TABS
2.0000 | ORAL_TABLET | ORAL | Status: DC
  Administered 2021-07-15: 2 via ORAL
  Filled 2021-07-15: qty 2

## 2021-07-15 MED ORDER — IBUPROFEN 600 MG PO TABS
600.0000 mg | ORAL_TABLET | Freq: Four times a day (QID) | ORAL | Status: DC
  Administered 2021-07-15 – 2021-07-16 (×5): 600 mg via ORAL
  Filled 2021-07-15 (×5): qty 1

## 2021-07-15 MED ORDER — SIMETHICONE 80 MG PO CHEW: 80.0000 mg | CHEWABLE_TABLET | ORAL | Status: DC | PRN

## 2021-07-15 MED ORDER — BENZOCAINE-MENTHOL 20-0.5 % EX AERO
1.0000 "application " | INHALATION_SPRAY | CUTANEOUS | Status: DC | PRN
  Filled 2021-07-15: qty 56

## 2021-07-15 MED ORDER — PRENATAL MULTIVITAMIN CH
1.0000 | ORAL_TABLET | Freq: Every day | ORAL | Status: DC
  Administered 2021-07-15: 1 via ORAL
  Filled 2021-07-15: qty 1

## 2021-07-15 MED ORDER — DIBUCAINE (PERIANAL) 1 % EX OINT: 1.0000 "application " | TOPICAL_OINTMENT | CUTANEOUS | Status: DC | PRN

## 2021-07-15 MED ORDER — TETANUS-DIPHTH-ACELL PERTUSSIS 5-2.5-18.5 LF-MCG/0.5 IM SUSY: 0.5000 mL | PREFILLED_SYRINGE | Freq: Once | INTRAMUSCULAR | Status: DC

## 2021-07-15 MED ORDER — WITCH HAZEL-GLYCERIN EX PADS: 1.0000 "application " | MEDICATED_PAD | CUTANEOUS | Status: DC | PRN

## 2021-07-15 MED ORDER — ACETAMINOPHEN 325 MG PO TABS
650.0000 mg | ORAL_TABLET | ORAL | Status: DC | PRN
  Administered 2021-07-15: 650 mg via ORAL
  Filled 2021-07-15: qty 2

## 2021-07-15 NOTE — Discharge Summary (Addendum)
Postpartum Discharge Summary      Patient Name: Teresa Beck DOB: December 17, 1985 MRN: [id number]  Date of admission: 07/14/2021 Delivery date:07/15/2021  Delivering provider: Gifford Shave  Date of discharge: 07/16/2021  Admitting diagnosis: Labor and delivery, indication for care [O75.9] Intrauterine pregnancy: [redacted]w[redacted]d     Secondary diagnosis:  Active Problems:   Labor and delivery, indication for care  Additional problems: none    Discharge diagnosis: Term Pregnancy Delivered                                              Post partum procedures: none Augmentation: Pitocin Complications: None  Hospital course: Induction of Labor With Vaginal Delivery   35 y.o. yo [id number] at [redacted]w[redacted]d was admitted to the hospital 07/14/2021 for induction of labor.  Indication for induction: PROM.  Patient had an uncomplicated labor course as follows: Membrane Rupture Time/Date: 12:00 PM ,07/14/2021   Delivery Method:Vaginal, Spontaneous  Episiotomy: None  Lacerations:  1st degree  Details of delivery can be found in separate delivery note.  Patient had a routine postpartum course. Patient is discharged home 07/16/21.  Newborn Data: Birth date:07/15/2021  Birth time:12:37 AM  Gender:Female  Living status:Living  Apgars:9 ,9  Weight:3340 g   Magnesium Sulfate received: No BMZ received: No Rhophylac:N/A MMR:N/A T-DaP:Given prenatally Flu: N/A Transfusion:No  Physical exam  Vitals:   07/15/21 1156 07/15/21 1535 07/15/21 1940 07/16/21 0543  BP: 102/60 (!) 108/59 107/65 110/75  Pulse: 70 68 77 70  Resp: $Remo'18 17 18 20  'LsqYM$ Temp: 98 F (36.7 C) 98 F (36.7 C) 98.9 F (37.2 C) 98.4 F (36.9 C)  TempSrc: Oral Oral Oral Oral  SpO2:   99% 95%  Weight:      Height:       General: alert, cooperative, and no distress Lochia: appropriate Uterine Fundus: firm Incision: N/A DVT Evaluation: No evidence of DVT seen on physical exam. Labs: Lab Results  Component Value Date   WBC 17.8 (H)  07/15/2021   HGB 9.5 (L) 07/15/2021   HCT 29.1 (L) 07/15/2021   MCV 86.1 07/15/2021   PLT 258 07/15/2021   CMP Latest Ref Rng & Units 07/14/2021  Glucose 70 - 99 mg/dL 83  BUN 6 - 20 mg/dL 7  Creatinine 0.44 - 1.00 mg/dL 0.54  Sodium 135 - 145 mmol/L 134(L)  Potassium 3.5 - 5.1 mmol/L 4.0  Chloride 98 - 111 mmol/L 104  CO2 22 - 32 mmol/L 19(L)  Calcium 8.9 - 10.3 mg/dL 9.0  Total Protein 6.5 - 8.1 g/dL 7.0  Total Bilirubin 0.3 - 1.2 mg/dL 0.4  Alkaline Phos 38 - 126 U/L 190(H)  AST 15 - 41 U/L 16  ALT 0 - 44 U/L 14   Edinburgh Score: Edinburgh Postnatal Depression Scale Screening Tool 07/15/2021  I have been able to laugh and see the funny side of things. 1  I have looked forward with enjoyment to things. 0  I have blamed myself unnecessarily when things went wrong. 0  I have been anxious or worried for no good reason. 0  I have felt scared or panicky for no good reason. 1  Things have been getting on top of me. 0  I have been so unhappy that I have had difficulty sleeping. 1  I have felt sad or miserable. 1  I have been so unhappy that  I have been crying. 1  The thought of harming myself has occurred to me. 0  Edinburgh Postnatal Depression Scale Total 5     After visit meds:  Allergies as of 07/16/2021   No Known Allergies      Medication List     TAKE these medications    albuterol 108 (90 Base) MCG/ACT inhaler Commonly known as: VENTOLIN HFA Inhale 2 puffs into the lungs every 6 (six) hours as needed for wheezing or shortness of breath.   CALCIUM 1000 + D PO Take 2 tablets by mouth every morning.   ibuprofen 800 MG tablet Commonly known as: ADVIL Take 1 tablet (800 mg total) by mouth every 8 (eight) hours as needed.   indomethacin 25 MG capsule Commonly known as: INDOCIN Take 25 mg by mouth 3 (three) times daily with meals.   methocarbamol 500 MG tablet Commonly known as: ROBAXIN Take 1 tablet (500 mg total) by mouth at bedtime as needed for muscle  spasms.   naproxen sodium 220 MG tablet Commonly known as: ALEVE Take 220 mg by mouth 2 (two) times daily as needed (pain).         Discharge home in stable condition Infant Feeding: Bottle and Breast Infant Disposition:home with mother Discharge instruction: per After Visit Summary and Postpartum booklet. Activity: Advance as tolerated. Pelvic rest for 6 weeks.  Diet: routine diet Future Appointments:No future appointments.  Follow up Visit: N/A, prenatal care in [place]  07/16/2021 Gifford Shave, MD  Midwife attestation I have seen and examined this patient and agree with above documentation in the resident's note.   ETHAN KASPERSKI is a 35 y.o. [id number] s/p vaginal delivery.   Pain is well controlled.  Plan for birth control is  nexplanon .  Method of Feeding: breast  PE:  BP 110/75 (BP Location: Right Arm)   Pulse 70   Temp 98.4 F (36.9 C) (Oral)   Resp 20   Ht $R'5\' 2"'Uc$  (1.575 m)   Wt 99.8 kg   LMP 10/28/2020 (Approximate)   SpO2 95%   Breastfeeding Unknown   BMI 40.24 kg/m  Gen: well appearing Heart: reg rate Lungs: normal WOB Fundus firm Ext: soft, no pain, no edema  Recent Labs    07/14/21 1858 07/15/21 0447  HGB 11.3* 9.5*  HCT 34.3* 29.1*     Plan: discharge today - postpartum care discussed - f/u clinic in 6 weeks for postpartum visit   Fatima Blank, CNM 11:15 AM

## 2021-07-15 NOTE — Anesthesia Postprocedure Evaluation (Signed)
Anesthesia Post Note  Patient: Teresa Beck  Procedure(s) Performed: AN AD HOC LABOR EPIDURAL     Patient location during evaluation: Mother Baby Anesthesia Type: Epidural Level of consciousness: awake Pain management: satisfactory to patient Vital Signs Assessment: post-procedure vital signs reviewed and stable Respiratory status: spontaneous breathing Cardiovascular status: stable Anesthetic complications: no   No notable events documented.  Last Vitals:  Vitals:   07/15/21 0338 07/15/21 0729  BP: 110/62 99/60  Pulse: 74 62  Resp: 18 17  Temp: 36.6 C 36.7 C  SpO2:      Last Pain:  Vitals:   07/15/21 0730  TempSrc:   PainSc: 3    Pain Goal:                Epidural/Spinal Function Cutaneous sensation: Normal sensation (07/15/21 0730)  Cephus Shelling

## 2021-07-15 NOTE — Plan of Care (Signed)
  Problem: Education: Goal: Knowledge of condition will improve Outcome: Completed/Met

## 2021-07-15 NOTE — Lactation Note (Signed)
This note was copied from a baby's chart. Lactation Consultation Note  Patient Name: Teresa Beck [id number] Date: 07/15/2021   Age:35 hours LC talked with RN on (L&D unit ) using Vocera, mom would like LC services on MBU , but not in L&D.  Maternal Data    Feeding    LATCH Score                    Lactation Tools Discussed/Used    Interventions    Discharge    Consult Status      [name] 07/15/2021, 2:08 AM

## 2021-07-15 NOTE — Lactation Note (Signed)
This note was copied from a baby's chart. Lactation Consultation Note  Patient Name: Girl CHRYSA RAMPY [id number] Date: 07/15/2021 Reason for consult: Initial assessment;Early term 35-38.6wks;Other (Comment) (Mom's feeding choice is breast and formula feeding.) Age:35 hours P3, 15 hour ETI female infant. Per mom, breastfeeding is going well, no pain with latch only a tug and infant is now feeding 10 minutes, most feedings. LC did not observe latch  due to infant having a bath at this time. LC gave mom hand pump and explained how to use for home Prn. LC discussed infant's input and output with parents. Mom shown how to use hand pump & how to disassemble, clean, & reassemble parts.  Mom made aware of O/P services, breastfeeding support groups, community resources, and our phone # for post-discharge questions.   Mom's plan: 1- Mom will breastfeed infant according to cues, 8 to 12+ times within 24 hours, skin to skin, latching infant on both breast during a feeding if possible. 2- Mom will supplement infant after latching infant at the breast, this is "her choice," using a slow flow bottle nipple, mom knows on day 1 after breastfeeding infant to offer ( 5-7 mls of formula) per feeding. 3- Mom knows to call RN/LC if she has any breastfeeding questions, concerns or need assistance with latching infant at the breast.   Maternal Data Has patient been taught Hand Expression?: Yes Does the patient have breastfeeding experience prior to this delivery?: Yes How long did the patient breastfeed?: Per mom, she breastfed her 1st child for 3 years and 2nd child who is now 35 years old for 18 months.  Feeding Mother's Current Feeding Choice: Breast Milk and Formula  LATCH Score                    Lactation Tools Discussed/Used    Interventions Interventions: Breast feeding basics reviewed;Skin to skin;Position options;Hand express;Education;Hand pump;Breast compression  Discharge Pump:  Manual WIC Program: Yes  Consult Status Consult Status: Follow-up Date: 07/16/21 Follow-up type: In-patient    [name] 07/15/2021, 4:24 PM

## 2021-07-16 MED ORDER — IBUPROFEN 600 MG PO TABS: 600.0000 mg | ORAL_TABLET | Freq: Four times a day (QID) | ORAL | 0 refills | Status: DC

## 2021-07-16 MED ORDER — ACETAMINOPHEN 325 MG PO TABS: 650.0000 mg | ORAL_TABLET | ORAL | 0 refills | Status: AC | PRN

## 2021-07-16 NOTE — Lactation Note (Signed)
This note was copied from a baby's chart. Lactation Consultation Note  Patient Name: Teresa Beck Date: 07/16/2021 Reason for consult: Follow-up assessment;Infant weight loss;Nipple pain/trauma;Early term 37-38.6wks;Other (Comment) (mom formula feeding as LC entered the room. mom C/O sore nipples and the baby only feeds for . LC reviewed supply + demand / importance of the baby having consistent breast feeding  at the breast. Protect the milk supply. LC assess breast tissue /) Age:44 hours  Maternal Data Has patient been taught Hand Expression?: Yes  Feeding Mother's Current Feeding Choice: Breast Milk and Formula Nipple Type: Slow - flow  LATCH Score                    Lactation Tools Discussed/Used Tools: Shells;Pump;Flanges Flange Size: 24 Breast pump type: Manual Pump Education: Milk Storage  Interventions Interventions: Breast feeding basics reviewed;Education  Discharge Discharge Education: Engorgement and breast care;Warning signs for feeding baby Pump: Manual WIC Program: Yes  Consult Status Consult Status: Complete Date: 07/16/21    Kathrin Greathouse 07/16/2021, 11:26 AM

## 2021-07-17 LAB — RUBELLA SCREEN: Rubella: 19.1 index (ref >0.99)

## 2021-07-30 ENCOUNTER — Telehealth (HOSPITAL_COMMUNITY): Payer: Self-pay | Admitting: *Deleted

## 2021-07-30 NOTE — Telephone Encounter (Signed)
Left message to return nurse call.  Duffy Rhody, RN 07-30-2021 at 10:17am

## 2022-09-02 ENCOUNTER — Other Ambulatory Visit: Payer: Self-pay | Admitting: Nurse Practitioner

## 2022-09-02 DIAGNOSIS — R1011 Right upper quadrant pain: Secondary | ICD-10-CM

## 2022-09-03 ENCOUNTER — Ambulatory Visit
Admission: RE | Admit: 2022-09-03 | Discharge: 2022-09-03 | Disposition: A | Discharge status: Home / Self Care | Payer: No Typology Code available for payment source | Source: Ambulatory Visit | Attending: Nurse Practitioner | Admitting: Nurse Practitioner

## 2022-09-03 DIAGNOSIS — R1011 Right upper quadrant pain: Secondary | ICD-10-CM

## 2023-01-06 ENCOUNTER — Other Ambulatory Visit: Payer: Self-pay | Admitting: Nurse Practitioner

## 2023-01-06 DIAGNOSIS — R1011 Right upper quadrant pain: Secondary | ICD-10-CM

## 2023-01-13 ENCOUNTER — Ambulatory Visit
Admission: RE | Admit: 2023-01-13 | Discharge: 2023-01-13 | Disposition: A | Discharge status: Home / Self Care | Payer: No Typology Code available for payment source | Source: Ambulatory Visit | Attending: Nurse Practitioner | Admitting: Nurse Practitioner

## 2023-01-13 DIAGNOSIS — R1011 Right upper quadrant pain: Secondary | ICD-10-CM

## 2023-05-19 ENCOUNTER — Ambulatory Visit (INDEPENDENT_AMBULATORY_CARE_PROVIDER_SITE_OTHER): Payer: No Typology Code available for payment source

## 2023-05-19 ENCOUNTER — Ambulatory Visit
Admission: RE | Admit: 2023-05-19 | Discharge: 2023-05-19 | Disposition: A | Discharge status: Home / Self Care | Payer: No Typology Code available for payment source | Source: Ambulatory Visit | Attending: Internal Medicine | Admitting: Internal Medicine

## 2023-05-19 ENCOUNTER — Other Ambulatory Visit: Payer: Self-pay

## 2023-05-19 VITALS — BP 111/70 | HR 65 | Temp 98.0°F | Resp 20

## 2023-05-19 DIAGNOSIS — J029 Acute pharyngitis, unspecified: Secondary | ICD-10-CM | POA: Insufficient documentation

## 2023-05-19 DIAGNOSIS — R519 Headache, unspecified: Secondary | ICD-10-CM | POA: Insufficient documentation

## 2023-05-19 DIAGNOSIS — Z1152 Encounter for screening for COVID-19: Secondary | ICD-10-CM | POA: Diagnosis not present

## 2023-05-19 DIAGNOSIS — B349 Viral infection, unspecified: Secondary | ICD-10-CM

## 2023-05-19 LAB — POCT RAPID STREP A (OFFICE): Rapid Strep A Screen: NEGATIVE

## 2023-05-19 MED ORDER — KETOROLAC TROMETHAMINE 30 MG/ML IJ SOLN
30.0000 mg | Freq: Once | INTRAMUSCULAR | Status: AC
  Administered 2023-05-19: 30 mg via INTRAMUSCULAR

## 2023-05-19 MED ORDER — PREDNISONE 20 MG PO TABS: 40.0000 mg | ORAL_TABLET | Freq: Every day | ORAL | 0 refills | Status: AC

## 2023-05-19 NOTE — ED Triage Notes (Signed)
Pt c/o sore throat radiating to both ears, fever, chest discomfort radiating through to back on inspiration, and headache x4 days. States taking tylenol with little relief. Denies SOB on exertion.

## 2023-05-19 NOTE — ED Provider Notes (Signed)
[name]    CSN: [id number] Arrival date & time: 05/19/23  1001      History   Chief Complaint Chief Complaint  Patient presents with   Fever   Sore Throat    HPI Teresa Beck is a 37 y.o. female.   Patient presents with fever, chest discomfort, sore throat, headache, ear pain started about 4 to 5 days ago.  Patient denies nasal congestion, runny nose, cough.  Reports the headache is present in the bilateral sides of the head and radiates down to the neck.  Patient is not sure of Tmax at home but states that she has felt feverish.  Denies any known sick contacts.  Denies shortness of breath but reports chest discomfort occurs in the center of the chest and typically only occurs with inspiration.  Reports history of asthma and has been using her albuterol inhaler with improvement in symptoms.  Denies cardiac history.  Patient has also taken Advil yesterday for symptoms.   Fever Sore Throat    Past Medical History:  Diagnosis Date   Asthma    Chronic kidney disease    stones and infection   Ovarian cyst     Patient Active Problem List   Diagnosis Date Noted   Labor and delivery, indication for care 07/14/2021    Past Surgical History:  Procedure Laterality Date   NO PAST SURGERIES      OB History     Gravida  4   Para  4   Term  2   Preterm  2   AB  0   Living  3      SAB  0   IAB  0   Ectopic  0   Multiple  0   Live Births  4            Home Medications    Prior to Admission medications   Medication Sig Start Date End Date Taking? Authorizing Provider  predniSONE (DELTASONE) 20 MG tablet Take 2 tablets (40 mg total) by mouth daily for 5 days. 05/19/23 05/24/23 Yes Markeisha Mancias, Acie Fredrickson, FNP  acetaminophen (TYLENOL) 325 MG tablet Take 2 tablets (650 mg total) by mouth every 4 (four) hours as needed (for pain scale < 4). 07/16/21   Leftwich-Kirby, Wilmer Floor, CNM  albuterol (PROVENTIL HFA;VENTOLIN HFA) 108 (90 Base) MCG/ACT  inhaler Inhale 2 puffs into the lungs every 6 (six) hours as needed for wheezing or shortness of breath.    [provider]  Calcium Carb-Cholecalciferol (CALCIUM 1000 + D PO) Take 2 tablets by mouth every morning.    [provider]  ibuprofen (ADVIL) 600 MG tablet Take 1 tablet (600 mg total) by mouth every 6 (six) hours. 07/16/21   Leftwich-Kirby, Wilmer Floor, CNM  ibuprofen (ADVIL,MOTRIN) 800 MG tablet Take 1 tablet (800 mg total) by mouth every 8 (eight) hours as needed. Patient not taking: Reported on 06/17/2020 01/29/17   Georgiana Shore, PA-C  methocarbamol (ROBAXIN) 500 MG tablet Take 1 tablet (500 mg total) by mouth at bedtime as needed for muscle spasms. Patient not taking: Reported on 07/17/2017 01/29/17   Mathews Robinsons B, PA-C  naproxen sodium (ANAPROX) 220 MG tablet Take 220 mg by mouth 2 (two) times daily as needed (pain).    [provider]    Family History History reviewed. No pertinent family history.  Social History Social History   Tobacco Use   Smoking status: Never   Smokeless tobacco: Never  Substance Use Topics   Alcohol use: Yes   Drug use: No     Allergies   Patient has no known allergies.   Review of Systems Review of Systems Per HPI  Physical Exam Triage Vital Signs ED Triage Vitals  Enc Vitals Group     BP 05/19/23 1006 111/70     Pulse Rate 05/19/23 1006 65     Resp 05/19/23 1006 20     Temp 05/19/23 1006 98 F (36.7 C)     Temp Source 05/19/23 1006 Oral     SpO2 05/19/23 1006 97 %     Weight --      Height --      Head Circumference --      Peak Flow --      Pain Score 05/19/23 1020 0     Pain Loc --      Pain Edu? --      Excl. in GC? --    No data found.  Updated Vital Signs BP 111/70 (BP Location: Left Arm)   Pulse 65   Temp 98 F (36.7 C) (Oral)   Resp 20   LMP 05/18/2023   SpO2 97%   Breastfeeding No   Visual Acuity Right Eye Distance:   Left Eye Distance:   Bilateral Distance:    Right  Eye Near:   Left Eye Near:    Bilateral Near:     Physical Exam Constitutional:      General: She is not in acute distress.    Appearance: Normal appearance. She is not toxic-appearing or diaphoretic.  HENT:     Head: Normocephalic and atraumatic.     Right Ear: Ear canal normal. No drainage, swelling or tenderness. A middle ear effusion is present. Tympanic membrane is not perforated, erythematous or bulging.     Left Ear: Ear canal normal. No drainage, swelling or tenderness. A middle ear effusion is present. Tympanic membrane is not perforated, erythematous or bulging.     Nose: No congestion.     Mouth/Throat:     Mouth: Mucous membranes are moist.     Pharynx: Posterior oropharyngeal erythema present.  Eyes:     Extraocular Movements: Extraocular movements intact.     Conjunctiva/sclera: Conjunctivae normal.     Pupils: Pupils are equal, round, and reactive to light.  Cardiovascular:     Rate and Rhythm: Normal rate and regular rhythm.     Pulses: Normal pulses.     Heart sounds: Normal heart sounds.  Pulmonary:     Effort: Pulmonary effort is normal. No respiratory distress.     Breath sounds: Normal breath sounds. No stridor. No wheezing, rhonchi or rales.  Abdominal:     General: Abdomen is flat. Bowel sounds are normal.     Palpations: Abdomen is soft.  Musculoskeletal:        General: Normal range of motion.     Cervical back: Normal range of motion.  Skin:    General: Skin is warm and dry.  Neurological:     General: No focal deficit present.     Mental Status: She is alert and oriented to person, place, and time. Mental status is at baseline.     Cranial Nerves: Cranial nerves 2-12 are intact.     Sensory: Sensation is intact.     Motor: Motor function is intact.     Coordination: Coordination is intact.     Gait: Gait is intact.  Psychiatric:  Mood and Affect: Mood normal.        Behavior: Behavior normal.      UC Treatments / Results  Labs (all  labs ordered are listed, but only abnormal results are displayed) Labs Reviewed  CULTURE, GROUP A STREP ([facility])  SARS CORONAVIRUS 2 (TAT 6-24 HRS)  POCT RAPID STREP A (OFFICE)    EKG   Radiology DG Chest 2 View  Result Date: 05/19/2023 CLINICAL DATA:  Chest pain EXAM: CHEST - 2 VIEW COMPARISON:  06/17/2020 FINDINGS: The heart size and mediastinal contours are within normal limits. Both lungs are clear. The visualized skeletal structures are unremarkable. IMPRESSION: No active cardiopulmonary disease. Electronically Signed   By: Duanne Guess D.O.   On: 05/19/2023 11:18    Procedures Procedures (including critical care time)  Medications Ordered in UC Medications  ketorolac (TORADOL) 30 MG/ML injection 30 mg (30 mg Intramuscular Given 05/19/23 1141)    Initial Impression / Assessment and Plan / UC Course  I have reviewed the triage vital signs and the nursing notes.  Pertinent labs & imaging results that were available during my care of the patient were reviewed by me and considered in my medical decision making (see chart for details).     Suspect some form of viral illness causing inflammation in chest and associated symptoms.  EKG was normal sinus rhythm and unchanged when compared to previous EKG.  Lung sounds were normal but chest x-ray was completed that was negative for any acute cardiopulmonary process.  Suspect slight asthma exacerbation as well so prednisone steroid burst was prescribed.  Patient to continue albuterol inhaler as needed.  Rapid strep was negative.  Throat culture and COVID test pending.  Patient complaining of acute headache so IM Toradol was administered.  Advised no NSAIDs for at least 24 hours following injection.  Patient is not in any acute distress so do not think that emergent evaluation is necessary at this time.  Vital signs are also stable.  Low suspicion for anything worrisome such as cardiac etiology or PE.  Patient was given strict return and ER  precautions.  Patient verbalized understanding and was agreeable with plan. Final Clinical Impressions(s) / UC Diagnoses   Final diagnoses:  Viral illness  Acute nonintractable headache, unspecified headache type  Sore throat     Discharge Instructions      I suspect that you have some form of a viral illness causing your symptoms and inflammation in your chest.  I have prescribed you prednisone which should help alleviate this.  You were also given a shot today in urgent care to help alleviate your headache.  Do not take any ibuprofen, Advil, Aleve for at least 24 hours following injection.  Your rapid strep is negative.  COVID test is pending.  Will call if it is abnormal.  Please go straight to the emergency department if symptoms persist or worsen.     ED Prescriptions     Medication Sig Dispense Auth. Provider   predniSONE (DELTASONE) 20 MG tablet Take 2 tablets (40 mg total) by mouth daily for 5 days. 10 tablet Gustavus Bryant, [place]      PDMP not reviewed this encounter.   Gustavus Bryant, [place] 05/19/23 1151

## 2023-05-19 NOTE — Discharge Instructions (Signed)
I suspect that you have some form of a viral illness causing your symptoms and inflammation in your chest.  I have prescribed you prednisone which should help alleviate this.  You were also given a shot today in urgent care to help alleviate your headache.  Do not take any ibuprofen, Advil, Aleve for at least 24 hours following injection.  Your rapid strep is negative.  COVID test is pending.  Will call if it is abnormal.  Please go straight to the emergency department if symptoms persist or worsen.

## 2023-05-20 LAB — CULTURE, GROUP A STREP (THRC)

## 2023-05-20 LAB — SARS CORONAVIRUS 2 (TAT 6-24 HRS): SARS Coronavirus 2: NEGATIVE

## 2023-05-21 LAB — CULTURE, GROUP A STREP (THRC)

## 2023-06-17 ENCOUNTER — Ambulatory Visit
Admission: RE | Admit: 2023-06-17 | Discharge: 2023-06-17 | Disposition: A | Discharge status: Home / Self Care | Payer: No Typology Code available for payment source | Source: Ambulatory Visit | Attending: Family Medicine | Admitting: Family Medicine

## 2023-06-17 VITALS — BP 106/71 | HR 75 | Temp 98.3°F | Resp 16 | Ht 60.0 in | Wt 220.0 lb

## 2023-06-17 DIAGNOSIS — J029 Acute pharyngitis, unspecified: Secondary | ICD-10-CM | POA: Diagnosis present

## 2023-06-17 LAB — POCT RAPID STREP A (OFFICE): Rapid Strep A Screen: NEGATIVE

## 2023-06-17 MED ORDER — KETOROLAC TROMETHAMINE 30 MG/ML IJ SOLN
30.0000 mg | Freq: Once | INTRAMUSCULAR | Status: AC
  Administered 2023-06-17: 30 mg via INTRAMUSCULAR

## 2023-06-17 MED ORDER — IBUPROFEN 800 MG PO TABS: 800.0000 mg | ORAL_TABLET | Freq: Three times a day (TID) | ORAL | 0 refills | Status: AC | PRN

## 2023-06-17 NOTE — ED Triage Notes (Signed)
Patient here today with c/o headache, nausea, neck pain, and ringing in her ears X 2 weeks. She has taken Tylenol and IBU with no relief. The steroid that she received from here helped a little.

## 2023-06-17 NOTE — Discharge Instructions (Signed)
Your strep test is negative.  Culture of the throat will be sent, and staff will notify you if that is in turn positive.  You have been given a shot of Toradol 30 mg today.   Take ibuprofen 800 mg--1 tab every 8 hours as needed for pain.  

## 2023-06-17 NOTE — ED Provider Notes (Signed)
[name]    CSN: [id number] Arrival date & time: 06/17/23  1225      History   Chief Complaint Chief Complaint  Patient presents with   Headache    Entered by patient    HPI Teresa Beck is a 37 y.o. female.    Headache Here for headache and sore throat.  She has had the sore throat and headache and neck pain for about 2 weeks.  She notes her ear is ringing also.  Headache has worsened in the last day.  She was seen here July 8 for upper respiratory trouble and asthma exacerbation.  That did improve and then she got sick again 2 weeks ago   No vomiting or diarrhea  Last menstrual cycle was August 3 Past Medical History:  Diagnosis Date   Asthma    Chronic kidney disease    stones and infection   Ovarian cyst     Patient Active Problem List   Diagnosis Date Noted   Labor and delivery, indication for care 07/14/2021    Past Surgical History:  Procedure Laterality Date   NO PAST SURGERIES      OB History     Gravida  4   Para  4   Term  2   Preterm  2   AB  0   Living  3      SAB  0   IAB  0   Ectopic  0   Multiple  0   Live Births  4            Home Medications    Prior to Admission medications   Medication Sig Start Date End Date Taking? Authorizing Provider  acetaminophen (TYLENOL) 325 MG tablet Take 2 tablets (650 mg total) by mouth every 4 (four) hours as needed (for pain scale < 4). 07/16/21  Yes Leftwich-Kirby, Wilmer Floor, CNM  ibuprofen (ADVIL) 800 MG tablet Take 1 tablet (800 mg total) by mouth every 8 (eight) hours as needed (pain). 06/17/23  Yes Zenia Resides, MD  albuterol (PROVENTIL HFA;VENTOLIN HFA) 108 (90 Base) MCG/ACT inhaler Inhale 2 puffs into the lungs every 6 (six) hours as needed for wheezing or shortness of breath.    [provider]    Family History History reviewed. No pertinent family history.  Social History Social History   Tobacco Use   Smoking status: Never    Smokeless tobacco: Never  Substance Use Topics   Alcohol use: Yes   Drug use: No     Allergies   Patient has no known allergies.   Review of Systems Review of Systems  Neurological:  Positive for headaches.     Physical Exam Triage Vital Signs ED Triage Vitals  Encounter Vitals Group     BP 06/17/23 1238 106/71     Systolic BP Percentile --      Diastolic BP Percentile --      Pulse Rate 06/17/23 1238 75     Resp 06/17/23 1238 16     Temp 06/17/23 1238 98.3 F (36.8 C)     Temp Source 06/17/23 1238 Oral     SpO2 06/17/23 1238 97 %     Weight 06/17/23 1237 220 lb (99.8 kg)     Height 06/17/23 1237 5' (1.524 m)     Head Circumference --      Peak Flow --      Pain Score 06/17/23 1237 8     Pain Loc --  Pain Education --      Exclude from Growth Chart --    No data found.  Updated Vital Signs BP 106/71 (BP Location: Left Arm)   Pulse 75   Temp 98.3 F (36.8 C) (Oral)   Resp 16   Ht 5' (1.524 m)   Wt 99.8 kg   LMP 06/14/2023 (Exact Date)   SpO2 97%   Breastfeeding No   BMI 42.97 kg/m   Visual Acuity Right Eye Distance:   Left Eye Distance:   Bilateral Distance:    Right Eye Near:   Left Eye Near:    Bilateral Near:     Physical Exam Vitals reviewed.  Constitutional:      General: She is not in acute distress.    Appearance: She is not ill-appearing, toxic-appearing or diaphoretic.  HENT:     Right Ear: Tympanic membrane and ear canal normal.     Left Ear: Tympanic membrane and ear canal normal.     Nose: Nose normal.     Mouth/Throat:     Mouth: Mucous membranes are moist.     Comments: There are some mild erythema of the posterior oropharynx and the tonsils.  The tonsils are 1+ in size.  No asymmetry Eyes:     Extraocular Movements: Extraocular movements intact.     Conjunctiva/sclera: Conjunctivae normal.     Pupils: Pupils are equal, round, and reactive to light.  Cardiovascular:     Rate and Rhythm: Normal rate and regular rhythm.      Heart sounds: No murmur heard. Pulmonary:     Effort: Pulmonary effort is normal. No respiratory distress.     Breath sounds: No stridor. No wheezing, rhonchi or rales.  Musculoskeletal:     Cervical back: Neck supple.  Lymphadenopathy:     Cervical: No cervical adenopathy.  Skin:    Capillary Refill: Capillary refill takes less than 2 seconds.     Coloration: Skin is not jaundiced or pale.  Neurological:     General: No focal deficit present.     Mental Status: She is alert and oriented to person, place, and time.  Psychiatric:        Behavior: Behavior normal.      UC Treatments / Results  Labs (all labs ordered are listed, but only abnormal results are displayed) Labs Reviewed  CULTURE, GROUP A STREP [facility])  POCT RAPID STREP A (OFFICE)    EKG   Radiology No results found.  Procedures Procedures (including critical care time)  Medications Ordered in UC Medications  ketorolac (TORADOL) 30 MG/ML injection 30 mg (has no administration in time range)    Initial Impression / Assessment and Plan / UC Course  I have reviewed the triage vital signs and the nursing notes.  Pertinent labs & imaging results that were available during my care of the patient were reviewed by me and considered in my medical decision making (see chart for details).     Strep is negative.  Throat culture is sent and we will notify and treat protocol positive.  Toradol was given for pain here.  Ibuprofen is sent to the pharmacy  I cannot tell that she has any true new symptoms last 2 to 3 days that would warrant testing for COVID.   Final Clinical Impressions(s) / UC Diagnoses   Final diagnoses:  Sore throat  Acute pharyngitis, unspecified etiology     Discharge Instructions      Your strep test is negative.  Culture of  the throat will be sent, and staff will notify you if that is in turn positive.  You have been given a shot of Toradol 30 mg today.  Take ibuprofen 800  mg--1 tab every 8 hours as needed for pain.       ED Prescriptions     Medication Sig Dispense Auth. Provider   ibuprofen (ADVIL) 800 MG tablet Take 1 tablet (800 mg total) by mouth every 8 (eight) hours as needed (pain). 21 tablet , Janace Aris, MD      PDMP not reviewed this encounter.   Zenia Resides, MD 06/17/23 1323

## 2023-07-20 ENCOUNTER — Ambulatory Visit: Payer: Self-pay

## 2023-09-09 ENCOUNTER — Ambulatory Visit: Payer: Self-pay
